# Patient Record
Sex: Female | Born: 1965 | Race: White | Hispanic: No | Marital: Married | Smoking: Former smoker
Health system: Southern US, Community
[De-identification: ages and names within clinical notes are randomized; demographics above are authoritative.]

## PROBLEM LIST (undated history)

## (undated) DIAGNOSIS — I10 Essential (primary) hypertension: Secondary | ICD-10-CM

## (undated) DIAGNOSIS — K5792 Diverticulitis of intestine, part unspecified, without perforation or abscess without bleeding: Secondary | ICD-10-CM

## (undated) DIAGNOSIS — E119 Type 2 diabetes mellitus without complications: Secondary | ICD-10-CM

## (undated) DIAGNOSIS — J449 Chronic obstructive pulmonary disease, unspecified: Secondary | ICD-10-CM

## (undated) DIAGNOSIS — J45909 Unspecified asthma, uncomplicated: Secondary | ICD-10-CM

## (undated) HISTORY — PX: CHOLECYSTECTOMY: SHX55

## (undated) HISTORY — PX: CYSTOSTOMY W/ BLADDER DILATION: SHX1432

---

## 2017-09-28 ENCOUNTER — Encounter (HOSPITAL_COMMUNITY): Payer: Self-pay | Admitting: Emergency Medicine

## 2017-09-28 ENCOUNTER — Emergency Department (HOSPITAL_COMMUNITY): Payer: Medicaid - Out of State

## 2017-09-28 ENCOUNTER — Other Ambulatory Visit: Payer: Self-pay

## 2017-09-28 ENCOUNTER — Emergency Department (HOSPITAL_COMMUNITY)
Admission: EM | Admit: 2017-09-28 | Discharge: 2017-09-28 | Disposition: A | Discharge status: Home / Self Care | Payer: Medicaid - Out of State | Attending: Emergency Medicine | Admitting: Emergency Medicine

## 2017-09-28 DIAGNOSIS — Z87891 Personal history of nicotine dependence: Secondary | ICD-10-CM | POA: Diagnosis not present

## 2017-09-28 DIAGNOSIS — K760 Fatty (change of) liver, not elsewhere classified: Secondary | ICD-10-CM | POA: Diagnosis not present

## 2017-09-28 DIAGNOSIS — R112 Nausea with vomiting, unspecified: Secondary | ICD-10-CM | POA: Diagnosis not present

## 2017-09-28 DIAGNOSIS — J449 Chronic obstructive pulmonary disease, unspecified: Secondary | ICD-10-CM | POA: Diagnosis not present

## 2017-09-28 DIAGNOSIS — K5792 Diverticulitis of intestine, part unspecified, without perforation or abscess without bleeding: Secondary | ICD-10-CM | POA: Diagnosis not present

## 2017-09-28 DIAGNOSIS — R197 Diarrhea, unspecified: Secondary | ICD-10-CM | POA: Insufficient documentation

## 2017-09-28 DIAGNOSIS — R161 Splenomegaly, not elsewhere classified: Secondary | ICD-10-CM | POA: Diagnosis not present

## 2017-09-28 DIAGNOSIS — Z885 Allergy status to narcotic agent status: Secondary | ICD-10-CM | POA: Insufficient documentation

## 2017-09-28 DIAGNOSIS — J45909 Unspecified asthma, uncomplicated: Secondary | ICD-10-CM | POA: Diagnosis not present

## 2017-09-28 DIAGNOSIS — R1084 Generalized abdominal pain: Secondary | ICD-10-CM | POA: Diagnosis present

## 2017-09-28 DIAGNOSIS — I10 Essential (primary) hypertension: Secondary | ICD-10-CM | POA: Diagnosis not present

## 2017-09-28 HISTORY — DX: Diverticulitis of intestine, part unspecified, without perforation or abscess without bleeding: K57.92

## 2017-09-28 HISTORY — DX: Chronic obstructive pulmonary disease, unspecified: J44.9

## 2017-09-28 HISTORY — DX: Unspecified asthma, uncomplicated: J45.909

## 2017-09-28 HISTORY — DX: Essential (primary) hypertension: I10

## 2017-09-28 LAB — CBC
HEMATOCRIT: 34 % — AB (ref 36.0–46.0)
HEMOGLOBIN: 10.6 g/dL — AB (ref 12.0–15.0)
MCH: 29 pg (ref 26.0–34.0)
MCHC: 31.2 g/dL (ref 30.0–36.0)
MCV: 92.9 fL (ref 78.0–100.0)
Platelets: 92 10*3/uL — ABNORMAL LOW (ref 150–400)
RBC: 3.66 MIL/uL — AB (ref 3.87–5.11)
RDW: 12.9 % (ref 11.5–15.5)
WBC: 5.6 10*3/uL (ref 4.0–10.5)

## 2017-09-28 LAB — COMPREHENSIVE METABOLIC PANEL
ALBUMIN: 3.9 g/dL (ref 3.5–5.0)
ALK PHOS: 52 U/L (ref 38–126)
ALT: 21 U/L (ref 14–54)
ANION GAP: 13 (ref 5–15)
AST: 27 U/L (ref 15–41)
BUN: 5 mg/dL — ABNORMAL LOW (ref 6–20)
CALCIUM: 9.3 mg/dL (ref 8.9–10.3)
CHLORIDE: 93 mmol/L — AB (ref 101–111)
CO2: 30 mmol/L (ref 22–32)
Creatinine, Ser: 0.69 mg/dL (ref 0.44–1.00)
GFR calc non Af Amer: >60 mL/min (ref >60)
GLUCOSE: 129 mg/dL — AB (ref 65–99)
POTASSIUM: 3.4 mmol/L — AB (ref 3.5–5.1)
Sodium: 136 mmol/L (ref 135–145)
Total Bilirubin: 0.6 mg/dL (ref 0.3–1.2)
Total Protein: 7.1 g/dL (ref 6.5–8.1)

## 2017-09-28 LAB — URINALYSIS, ROUTINE W REFLEX MICROSCOPIC
Bilirubin Urine: NEGATIVE
GLUCOSE, UA: NEGATIVE mg/dL
HGB URINE DIPSTICK: NEGATIVE
KETONES UR: NEGATIVE mg/dL
Nitrite: NEGATIVE
PROTEIN: NEGATIVE mg/dL
Specific Gravity, Urine: >1.046 — ABNORMAL HIGH (ref 1.005–1.030)
pH: 5 (ref 5.0–8.0)

## 2017-09-28 LAB — LIPASE, BLOOD: LIPASE: 30 U/L (ref 11–51)

## 2017-09-28 LAB — POC OCCULT BLOOD, ED: FECAL OCCULT BLD: NEGATIVE

## 2017-09-28 MED ORDER — ONDANSETRON HCL 4 MG/2ML IJ SOLN
4.0000 mg | INTRAMUSCULAR | Status: DC | PRN
  Administered 2017-09-28: 4 mg via INTRAVENOUS
  Filled 2017-09-28: qty 2

## 2017-09-28 MED ORDER — POTASSIUM CHLORIDE CRYS ER 20 MEQ PO TBCR
40.0000 meq | EXTENDED_RELEASE_TABLET | Freq: Once | ORAL | Status: AC
  Administered 2017-09-28: 40 meq via ORAL
  Filled 2017-09-28: qty 2

## 2017-09-28 MED ORDER — METRONIDAZOLE 500 MG PO TABS: 500.0000 mg | ORAL_TABLET | Freq: Three times a day (TID) | ORAL | 0 refills | Status: DC

## 2017-09-28 MED ORDER — CIPROFLOXACIN IN D5W 400 MG/200ML IV SOLN
400.0000 mg | Freq: Once | INTRAVENOUS | Status: AC
  Administered 2017-09-28: 400 mg via INTRAVENOUS
  Filled 2017-09-28: qty 200

## 2017-09-28 MED ORDER — IOPAMIDOL (ISOVUE-300) INJECTION 61%
100.0000 mL | Freq: Once | INTRAVENOUS | Status: AC | PRN
  Administered 2017-09-28: 100 mL via INTRAVENOUS

## 2017-09-28 MED ORDER — CIPROFLOXACIN HCL 500 MG PO TABS: 500.0000 mg | ORAL_TABLET | Freq: Two times a day (BID) | ORAL | 0 refills | Status: DC

## 2017-09-28 MED ORDER — SODIUM CHLORIDE 0.9 % IV BOLUS (SEPSIS)
500.0000 mL | Freq: Once | INTRAVENOUS | Status: AC
  Administered 2017-09-28: 500 mL via INTRAVENOUS

## 2017-09-28 MED ORDER — METRONIDAZOLE IN NACL 5-0.79 MG/ML-% IV SOLN
500.0000 mg | Freq: Once | INTRAVENOUS | Status: AC
  Administered 2017-09-28: 500 mg via INTRAVENOUS
  Filled 2017-09-28: qty 100

## 2017-09-28 MED ORDER — SODIUM CHLORIDE 0.9 % IV SOLN
INTRAVENOUS | Status: DC
  Administered 2017-09-28: 20:00:00 via INTRAVENOUS

## 2017-09-28 NOTE — ED Notes (Signed)
Tech at bedside with patient. Collecting vitals

## 2017-09-28 NOTE — ED Notes (Signed)
Pt denies nausea or vomiting since taking the potassium. Pt states she would like another Ginger ale.

## 2017-09-28 NOTE — ED Triage Notes (Addendum)
Patient reports history of diverticulitis, c/o n/v and abdominal pain that hurts into her rectum. Onset of symptoms for 1 week. Seen by PCP last week.

## 2017-09-28 NOTE — ED Notes (Signed)
3rd ginger ale given

## 2017-09-28 NOTE — Discharge Instructions (Signed)
Take the prescriptions as directed.  Take in a fluids diet for the next few days, followed by a bland diet. Advance to your regular diet slowly as you can tolerate it.  Avoid full strength juices, as well as milk and milk products until your diarrhea has resolved.   Call your regular medical doctor tomorrow to schedule a follow up appointment in the next 1 to 2 days. Call the GI doctor tomorrow to schedule a follow up appointment within the next week.  Return to the Emergency Department immediately if not improving (or even worsening) despite taking the medicines as prescribed, any black or bloody stool or vomit, if you develop a fever over "101," or for any other concerns.

## 2017-09-28 NOTE — ED Notes (Signed)
Pt drank all of her ginger ale and asked for another drink. Pt swallowed potassium pills with no problem. Will monitor for nausea or vomiting.

## 2017-09-28 NOTE — ED Notes (Signed)
Pt states she does not feel nauseous at this time. Ginger ale given to patient.

## 2017-09-28 NOTE — ED Provider Notes (Signed)
[facility] EMERGENCY DEPARTMENT Provider Note   CSN: [id number] Arrival date & time: 09/28/17  1200     History   Chief Complaint Chief Complaint  Patient presents with  . Abdominal Pain  . Emesis    HPI Albirtha Grinage is a 52 y.o. female.  HPI  Pt was seen at 1630.  Per pt, c/o gradual onset and persistence of constant generalized abd "pain" for the past 1 week.  Has been associated with multiple intermittent episodes of N/V/D.  Describes the abd pain as "throbbing" and "like the last time I had diverticulitis."  Denies fevers, no back pain, no rash, no CP/SOB, no black or blood in emesis, no blood in stools. Pt states her stools are "sometimes black because I take iron."       Past Medical History:  Diagnosis Date  . Asthma   . COPD (chronic obstructive pulmonary disease) (HCC)   . Diverticulitis   . Hypertension     There are no active problems to display for this patient.   Past Surgical History:  Procedure Laterality Date  . CHOLECYSTECTOMY    . CYSTOSTOMY W/ BLADDER DILATION      OB History    Gravida Para Term Preterm AB Living   3 3 3          SAB TAB Ectopic Multiple Live Births                   Home Medications    Prior to Admission medications   Not on File    Family History Family History  Problem Relation Age of Onset  . Diabetes Mother   . Hypertension Mother   . Alzheimer's disease Mother   . COPD Mother   . Emphysema Father   . Alzheimer's disease Father     Social History Social History   Tobacco Use  . Smoking status: Former Games developer  . Smokeless tobacco: Never Used  Substance Use Topics  . Alcohol use: No    Frequency: Never  . Drug use: No     Allergies   Codeine and Morphine and related   Review of Systems Review of Systems ROS: Statement: All systems negative except as marked or noted in the HPI; Constitutional: Negative for fever and chills. ; ; Eyes: Negative for eye pain, redness and discharge. ; ; ENMT:  Negative for ear pain, hoarseness, nasal congestion, sinus pressure and sore throat. ; ; Cardiovascular: Negative for chest pain, palpitations, diaphoresis, dyspnea and peripheral edema. ; ; Respiratory: Negative for cough, wheezing and stridor. ; ; Gastrointestinal: +N/V/D, abd pain. Negative for blood in stool, hematemesis, jaundice and rectal bleeding. ; ; Genitourinary: Negative for dysuria, flank pain and hematuria. ; ; Musculoskeletal: Negative for back pain and neck pain. Negative for swelling and trauma.; ; Skin: Negative for pruritus, rash, abrasions, blisters, bruising and skin lesion.; ; Neuro: Negative for headache, lightheadedness and neck stiffness. Negative for weakness, altered level of consciousness, altered mental status, extremity weakness, paresthesias, involuntary movement, seizure and syncope.       Physical Exam Updated Vital Signs BP (!) 103/49 (BP Location: Right Arm)   Pulse (!) 113   Temp 97.8 F (36.6 C) (Oral)   Resp 18   Ht 5' (1.524 m)   Wt 128.8 kg (284 lb)   SpO2 96%   BMI 55.46 kg/m    Patient Vitals for the past 24 hrs:  BP Temp Temp src Pulse Resp SpO2 Height Weight  09/28/17 2130 115/60 - - Marland Kitchen)  103 - 100 % - -  09/28/17 2052 - - - (!) 103 - 100 % - -  09/28/17 2030 - - - (!) 102 - 100 % - -  09/28/17 2026 - - - (!) 103 - 100 % - -  09/28/17 1933 (!) 130/55 - - (!) 110 20 100 % - -  09/28/17 1931 (!) 130/55 - - (!) 111 - 100 % - -  09/28/17 1930 114/65 - - (!) 104 - 100 % - -  09/28/17 1927 (!) 112/57 - - (!) 102 - 100 % - -  09/28/17 1700 106/66 - - (!) 102 - 100 % - -  09/28/17 1645 - - - - - 100 % - -  09/28/17 1642 (!) 110/47 97.7 F (36.5 C) Oral (!) 107 18 100 % - -  09/28/17 1530 (!) 103/49 97.8 F (36.6 C) Oral (!) 113 18 96 % - -  09/28/17 1247 - - - - - - 5' (1.524 m) 128.8 kg (284 lb)  09/28/17 1246 111/71 98 F (36.7 C) Oral (!) 110 20 94 % - -   19:28 Orthostatic Vital Signs LN  Orthostatic Lying   BP- Lying: 112/57  Pulse-  Lying: 102      Orthostatic Sitting  BP- Sitting: 114/65  Pulse- Sitting: 102      Orthostatic Standing at 0 minutes  BP- Standing at 0 minutes: 130/55  Pulse- Standing at 0 minutes: 110     Physical Exam 1635: Physical examination:  Nursing notes reviewed; Vital signs and O2 SAT reviewed;  Constitutional: Well developed, Well nourished, Well hydrated, In no acute distress; Head:  Normocephalic, atraumatic; Eyes: EOMI, PERRL, No scleral icterus; ENMT: Mouth and pharynx normal, Mucous membranes moist; Neck: Supple, Full range of motion, No lymphadenopathy; Cardiovascular: Tachycardic rate and rhythm, No gallop; Respiratory: Breath sounds coarse & equal bilaterally, No wheezes.  Speaking full sentences with ease, Normal respiratory effort/excursion; Chest: Nontender, Movement normal; Abdomen: Soft, Obese. +generalized tenderness to palp. No rebound or guarding. Nondistended, Normal bowel sounds; Genitourinary: No CVA tenderness; Extremities: Pulses normal, No tenderness, No edema, No calf edema or asymmetry.; Neuro: AA&Ox3, Major CN grossly intact.  Speech clear. No gross focal motor or sensory deficits in extremities.; Skin: Color normal, Warm, Dry.   ED Treatments / Results  Labs (all labs ordered are listed, but only abnormal results are displayed)   EKG  EKG Interpretation None       Radiology   Procedures Procedures (including critical care time)  Medications Ordered in ED Medications  sodium chloride 0.9 % bolus 500 mL (not administered)  0.9 %  sodium chloride infusion (not administered)  ondansetron (ZOFRAN) injection 4 mg (not administered)     Initial Impression / Assessment and Plan / ED Course  I have reviewed the triage vital signs and the nursing notes.  Pertinent labs & imaging results that were available during my care of the patient were reviewed by me and considered in my medical decision making (see chart for details).  MDM Reviewed: previous chart,  nursing note and vitals Reviewed previous: labs Interpretation: labs, CT scan and x-ray    Results for orders placed or performed during the hospital encounter of 09/28/17  Lipase, blood  Result Value Ref Range   Lipase 30 11 - 51 U/L  Comprehensive metabolic panel  Result Value Ref Range   Sodium 136 135 - 145 mmol/L   Potassium 3.4 (L) 3.5 - 5.1 mmol/L   Chloride 93 (L)  101 - 111 mmol/L   CO2 30 22 - 32 mmol/L   Glucose, Bld 129 (H) 65 - 99 mg/dL   BUN 5 (L) 6 - 20 mg/dL   Creatinine, Ser 1.61 0.44 - 1.00 mg/dL   Calcium 9.3 8.9 - 09.6 mg/dL   Total Protein 7.1 6.5 - 8.1 g/dL   Albumin 3.9 3.5 - 5.0 g/dL   AST 27 15 - 41 U/L   ALT 21 14 - 54 U/L   Alkaline Phosphatase 52 38 - 126 U/L   Total Bilirubin 0.6 0.3 - 1.2 mg/dL   GFR calc non Af Amer >60 >60 mL/min   GFR calc Af Amer >60 >60 mL/min   Anion gap 13 5 - 15  CBC  Result Value Ref Range   WBC 5.6 4.0 - 10.5 K/uL   RBC 3.66 (L) 3.87 - 5.11 MIL/uL   Hemoglobin 10.6 (L) 12.0 - 15.0 g/dL   HCT 04.5 (L) 40.9 - 81.1 %   MCV 92.9 78.0 - 100.0 fL   MCH 29.0 26.0 - 34.0 pg   MCHC 31.2 30.0 - 36.0 g/dL   RDW 91.4 78.2 - 95.6 %   Platelets 92 (L) 150 - 400 K/uL  Urinalysis, Routine w reflex microscopic  Result Value Ref Range   Color, Urine YELLOW YELLOW   APPearance HAZY (A) CLEAR   Specific Gravity, Urine >1.046 (H) 1.005 - 1.030   pH 5.0 5.0 - 8.0   Glucose, UA NEGATIVE NEGATIVE mg/dL   Hgb urine dipstick NEGATIVE NEGATIVE   Bilirubin Urine NEGATIVE NEGATIVE   Ketones, ur NEGATIVE NEGATIVE mg/dL   Protein, ur NEGATIVE NEGATIVE mg/dL   Nitrite NEGATIVE NEGATIVE   Leukocytes, UA TRACE (A) NEGATIVE   RBC / HPF 0-5 0 - 5 RBC/hpf   WBC, UA 0-5 0 - 5 WBC/hpf   Bacteria, UA RARE (A) NONE SEEN   Squamous Epithelial / LPF 0-5 (A) NONE SEEN   Mucus PRESENT   POC occult blood, ED  Result Value Ref Range   Fecal Occult Bld NEGATIVE NEGATIVE   Dg Chest 2 View Result Date: 09/28/2017 CLINICAL DATA:  Diverticulitis.  History of asthma. Chronic shortness of breath. EXAM: CHEST  2 VIEW COMPARISON:  None. FINDINGS: The heart size and mediastinal contours are within normal limits. Both lungs are clear. The visualized skeletal structures are unremarkable. IMPRESSION: No active cardiopulmonary disease. Electronically Signed   By: Gerome Sam III M.D   On: 09/28/2017 17:49   Ct Abdomen Pelvis W Contrast  Result Date: 09/28/2017 CLINICAL DATA:  52 y/o F; history of diverticulitis. Presenting with nausea, vomiting, and abdominal pain extending into the rectum. EXAM: CT ABDOMEN AND PELVIS WITH CONTRAST TECHNIQUE: Multidetector CT imaging of the abdomen and pelvis was performed using the standard protocol following bolus administration of intravenous contrast. CONTRAST:  ISOVUE-300 IOPAMIDOL (ISOVUE-300) INJECTION 61% COMPARISON:  None. FINDINGS: Lower chest: Small cluster of nodules in the dependent right lower lobe probably representing minimal bronchiolitis. Hepatobiliary: Hepatic steatosis. No focal liver abnormality is seen. Cholecystectomy. No biliary ductal dilatation. Pancreas: Unremarkable. No pancreatic ductal dilatation or surrounding inflammatory changes. Spleen: Spleen measures 14.6 x 9.7 x 18.5 cm (volume = 1400 cm^3). Adrenals/Urinary Tract: Adrenal glands are unremarkable. Kidneys are normal, without renal calculi, focal lesion, or hydronephrosis. Bladder is unremarkable. Stomach/Bowel: Stomach is within normal limits. Appendix appears normal. Moderate sigmoid diverticulosis. There is mild pericolonic edema and fluid within the left pericolic gutter surrounding the sigmoid colon (series 6, image 54). Vascular/Lymphatic:  Aortic atherosclerosis. No enlarged abdominal or pelvic lymph nodes. Reproductive: Uterus and bilateral adnexa are unremarkable. Other: Small paramedian right lower abdominal hernia containing fat. Mild mesenteric edema. Musculoskeletal: No acute or significant osseous findings. IMPRESSION: 1.  Moderate sigmoid diverticulosis. Mild sigmoid pericolonic edema and fluid in left pericolic gutter probably represents underlying colitis/diverticulitis. No perforation or abscess. 2. Hepatic steatosis. 3. Splenomegaly, 1400 cc. Electronically Signed   By: Mitzi HansenLance  Furusawa-Stratton M.D.   On: 09/28/2017 18:47    2130:  Pt has tol PO well while in the ED without N/V.  No stooling while in the ED. IVF given for tachycardia during orthostatic VS; pt denied orthostasis. Pt states she "gets anxious" and that is "why my HR is high." Pt offered admission. Pt absolutely does not want to be admitted. Risks/benefits explained. Pt verb understanding. Pt makes her own medical decisions and continues to refuse admission. Pt was willing to stay for IV abx doses. Strict return precautions given. Dx and testing d/w pt and family.  Questions answered.  Verb understanding, agreeable to d/c home with outpt f/u.      Final Clinical Impressions(s) / ED Diagnoses   Final diagnoses:  None    ED Discharge Orders    None       Samuel JesterMcManus, Jais Demir, DO 10/02/17 0818

## 2017-12-01 ENCOUNTER — Emergency Department (HOSPITAL_COMMUNITY): Payer: Medicaid - Out of State

## 2017-12-01 ENCOUNTER — Other Ambulatory Visit: Payer: Self-pay

## 2017-12-01 ENCOUNTER — Inpatient Hospital Stay (HOSPITAL_COMMUNITY)
Admission: EM | Admit: 2017-12-01 | Discharge: 2017-12-09 | DRG: 391 | Disposition: A | Discharge status: Home Health | Payer: Medicaid - Out of State | Attending: Family Medicine | Admitting: Family Medicine

## 2017-12-01 ENCOUNTER — Encounter (HOSPITAL_COMMUNITY): Payer: Self-pay | Admitting: *Deleted

## 2017-12-01 DIAGNOSIS — T451X5A Adverse effect of antineoplastic and immunosuppressive drugs, initial encounter: Secondary | ICD-10-CM

## 2017-12-01 DIAGNOSIS — Z6838 Body mass index (BMI) 38.0-38.9, adult: Secondary | ICD-10-CM

## 2017-12-01 DIAGNOSIS — Z1624 Resistance to multiple antibiotics: Secondary | ICD-10-CM | POA: Diagnosis not present

## 2017-12-01 DIAGNOSIS — Z885 Allergy status to narcotic agent status: Secondary | ICD-10-CM

## 2017-12-01 DIAGNOSIS — B9620 Unspecified Escherichia coli [E. coli] as the cause of diseases classified elsewhere: Secondary | ICD-10-CM | POA: Diagnosis not present

## 2017-12-01 DIAGNOSIS — Z87891 Personal history of nicotine dependence: Secondary | ICD-10-CM | POA: Diagnosis not present

## 2017-12-01 DIAGNOSIS — F419 Anxiety disorder, unspecified: Secondary | ICD-10-CM | POA: Diagnosis present

## 2017-12-01 DIAGNOSIS — F0280 Dementia in other diseases classified elsewhere without behavioral disturbance: Secondary | ICD-10-CM | POA: Diagnosis present

## 2017-12-01 DIAGNOSIS — B370 Candidal stomatitis: Secondary | ICD-10-CM | POA: Diagnosis present

## 2017-12-01 DIAGNOSIS — L8990 Pressure ulcer of unspecified site, unspecified stage: Secondary | ICD-10-CM | POA: Diagnosis present

## 2017-12-01 DIAGNOSIS — A419 Sepsis, unspecified organism: Secondary | ICD-10-CM | POA: Diagnosis not present

## 2017-12-01 DIAGNOSIS — G309 Alzheimer's disease, unspecified: Secondary | ICD-10-CM | POA: Diagnosis present

## 2017-12-01 DIAGNOSIS — K529 Noninfective gastroenteritis and colitis, unspecified: Secondary | ICD-10-CM | POA: Diagnosis present

## 2017-12-01 DIAGNOSIS — E860 Dehydration: Secondary | ICD-10-CM | POA: Diagnosis present

## 2017-12-01 DIAGNOSIS — D61810 Antineoplastic chemotherapy induced pancytopenia: Secondary | ICD-10-CM

## 2017-12-01 DIAGNOSIS — Z79899 Other long term (current) drug therapy: Secondary | ICD-10-CM

## 2017-12-01 DIAGNOSIS — E785 Hyperlipidemia, unspecified: Secondary | ICD-10-CM | POA: Diagnosis present

## 2017-12-01 DIAGNOSIS — K909 Intestinal malabsorption, unspecified: Secondary | ICD-10-CM | POA: Diagnosis present

## 2017-12-01 DIAGNOSIS — E43 Unspecified severe protein-calorie malnutrition: Secondary | ICD-10-CM | POA: Diagnosis present

## 2017-12-01 DIAGNOSIS — D61818 Other pancytopenia: Secondary | ICD-10-CM

## 2017-12-01 DIAGNOSIS — E871 Hypo-osmolality and hyponatremia: Secondary | ICD-10-CM | POA: Diagnosis present

## 2017-12-01 DIAGNOSIS — N390 Urinary tract infection, site not specified: Secondary | ICD-10-CM | POA: Diagnosis present

## 2017-12-01 DIAGNOSIS — J181 Lobar pneumonia, unspecified organism: Secondary | ICD-10-CM | POA: Diagnosis present

## 2017-12-01 DIAGNOSIS — E876 Hypokalemia: Secondary | ICD-10-CM | POA: Diagnosis present

## 2017-12-01 DIAGNOSIS — K5780 Diverticulitis of intestine, part unspecified, with perforation and abscess without bleeding: Secondary | ICD-10-CM | POA: Diagnosis not present

## 2017-12-01 DIAGNOSIS — E861 Hypovolemia: Secondary | ICD-10-CM | POA: Diagnosis not present

## 2017-12-01 DIAGNOSIS — D638 Anemia in other chronic diseases classified elsewhere: Secondary | ICD-10-CM | POA: Diagnosis present

## 2017-12-01 DIAGNOSIS — Z8719 Personal history of other diseases of the digestive system: Secondary | ICD-10-CM | POA: Diagnosis not present

## 2017-12-01 DIAGNOSIS — D696 Thrombocytopenia, unspecified: Secondary | ICD-10-CM

## 2017-12-01 DIAGNOSIS — D649 Anemia, unspecified: Secondary | ICD-10-CM | POA: Diagnosis not present

## 2017-12-01 DIAGNOSIS — K760 Fatty (change of) liver, not elsewhere classified: Secondary | ICD-10-CM | POA: Diagnosis present

## 2017-12-01 DIAGNOSIS — K5720 Diverticulitis of large intestine with perforation and abscess without bleeding: Secondary | ICD-10-CM | POA: Diagnosis present

## 2017-12-01 DIAGNOSIS — I10 Essential (primary) hypertension: Secondary | ICD-10-CM

## 2017-12-01 DIAGNOSIS — I959 Hypotension, unspecified: Secondary | ICD-10-CM | POA: Diagnosis present

## 2017-12-01 DIAGNOSIS — E8809 Other disorders of plasma-protein metabolism, not elsewhere classified: Secondary | ICD-10-CM | POA: Diagnosis present

## 2017-12-01 DIAGNOSIS — I5189 Other ill-defined heart diseases: Secondary | ICD-10-CM

## 2017-12-01 DIAGNOSIS — E1143 Type 2 diabetes mellitus with diabetic autonomic (poly)neuropathy: Secondary | ICD-10-CM | POA: Diagnosis present

## 2017-12-01 DIAGNOSIS — J440 Chronic obstructive pulmonary disease with acute lower respiratory infection: Secondary | ICD-10-CM | POA: Diagnosis present

## 2017-12-01 DIAGNOSIS — R7989 Other specified abnormal findings of blood chemistry: Secondary | ICD-10-CM | POA: Diagnosis present

## 2017-12-01 DIAGNOSIS — Z836 Family history of other diseases of the respiratory system: Secondary | ICD-10-CM | POA: Diagnosis not present

## 2017-12-01 DIAGNOSIS — R197 Diarrhea, unspecified: Secondary | ICD-10-CM | POA: Diagnosis not present

## 2017-12-01 DIAGNOSIS — E039 Hypothyroidism, unspecified: Secondary | ICD-10-CM | POA: Diagnosis present

## 2017-12-01 DIAGNOSIS — I5030 Unspecified diastolic (congestive) heart failure: Secondary | ICD-10-CM | POA: Diagnosis not present

## 2017-12-01 DIAGNOSIS — R5381 Other malaise: Secondary | ICD-10-CM | POA: Diagnosis present

## 2017-12-01 DIAGNOSIS — I9589 Other hypotension: Secondary | ICD-10-CM | POA: Diagnosis not present

## 2017-12-01 DIAGNOSIS — Z7952 Long term (current) use of systemic steroids: Secondary | ICD-10-CM

## 2017-12-01 DIAGNOSIS — R161 Splenomegaly, not elsewhere classified: Secondary | ICD-10-CM | POA: Diagnosis present

## 2017-12-01 DIAGNOSIS — D72829 Elevated white blood cell count, unspecified: Secondary | ICD-10-CM | POA: Diagnosis not present

## 2017-12-01 DIAGNOSIS — F329 Major depressive disorder, single episode, unspecified: Secondary | ICD-10-CM | POA: Diagnosis present

## 2017-12-01 DIAGNOSIS — I5032 Chronic diastolic (congestive) heart failure: Secondary | ICD-10-CM | POA: Diagnosis present

## 2017-12-01 DIAGNOSIS — J449 Chronic obstructive pulmonary disease, unspecified: Secondary | ICD-10-CM

## 2017-12-01 DIAGNOSIS — K5732 Diverticulitis of large intestine without perforation or abscess without bleeding: Secondary | ICD-10-CM | POA: Diagnosis not present

## 2017-12-01 DIAGNOSIS — L89150 Pressure ulcer of sacral region, unstageable: Secondary | ICD-10-CM | POA: Diagnosis present

## 2017-12-01 DIAGNOSIS — R627 Adult failure to thrive: Secondary | ICD-10-CM | POA: Diagnosis not present

## 2017-12-01 DIAGNOSIS — K219 Gastro-esophageal reflux disease without esophagitis: Secondary | ICD-10-CM | POA: Diagnosis present

## 2017-12-01 DIAGNOSIS — J189 Pneumonia, unspecified organism: Secondary | ICD-10-CM | POA: Diagnosis present

## 2017-12-01 DIAGNOSIS — D509 Iron deficiency anemia, unspecified: Secondary | ICD-10-CM | POA: Diagnosis present

## 2017-12-01 DIAGNOSIS — R571 Hypovolemic shock: Secondary | ICD-10-CM | POA: Diagnosis present

## 2017-12-01 DIAGNOSIS — R8281 Pyuria: Secondary | ICD-10-CM | POA: Diagnosis present

## 2017-12-01 DIAGNOSIS — J9611 Chronic respiratory failure with hypoxia: Secondary | ICD-10-CM | POA: Diagnosis present

## 2017-12-01 DIAGNOSIS — Z9981 Dependence on supplemental oxygen: Secondary | ICD-10-CM

## 2017-12-01 DIAGNOSIS — Z86010 Personal history of colonic polyps: Secondary | ICD-10-CM

## 2017-12-01 DIAGNOSIS — I110 Hypertensive heart disease with heart failure: Secondary | ICD-10-CM | POA: Diagnosis present

## 2017-12-01 HISTORY — DX: Type 2 diabetes mellitus without complications: E11.9

## 2017-12-01 LAB — URINALYSIS, ROUTINE W REFLEX MICROSCOPIC
Bilirubin Urine: NEGATIVE
Glucose, UA: NEGATIVE mg/dL
Ketones, ur: NEGATIVE mg/dL
Nitrite: NEGATIVE
PROTEIN: NEGATIVE mg/dL
Specific Gravity, Urine: >1.046 — ABNORMAL HIGH (ref 1.005–1.030)
pH: 5 (ref 5.0–8.0)

## 2017-12-01 LAB — COMPREHENSIVE METABOLIC PANEL
ALT: 12 U/L — AB (ref 14–54)
AST: 23 U/L (ref 15–41)
Albumin: 2.9 g/dL — ABNORMAL LOW (ref 3.5–5.0)
Alkaline Phosphatase: 100 U/L (ref 38–126)
Anion gap: 14 (ref 5–15)
BUN: 10 mg/dL (ref 6–20)
CHLORIDE: 88 mmol/L — AB (ref 101–111)
CO2: 24 mmol/L (ref 22–32)
CREATININE: 0.73 mg/dL (ref 0.44–1.00)
Calcium: 8.8 mg/dL — ABNORMAL LOW (ref 8.9–10.3)
GFR calc Af Amer: >60 mL/min (ref >60)
Glucose, Bld: 115 mg/dL — ABNORMAL HIGH (ref 65–99)
Potassium: 4.2 mmol/L (ref 3.5–5.1)
Sodium: 126 mmol/L — ABNORMAL LOW (ref 135–145)
Total Bilirubin: 0.7 mg/dL (ref 0.3–1.2)
Total Protein: 6.3 g/dL — ABNORMAL LOW (ref 6.5–8.1)

## 2017-12-01 LAB — SODIUM, URINE, RANDOM: Sodium, Ur: <10 mmol/L

## 2017-12-01 LAB — CBC WITH DIFFERENTIAL/PLATELET
Basophils Absolute: 0 10*3/uL (ref 0.0–0.1)
Basophils Relative: 0 %
Eosinophils Absolute: 0 10*3/uL (ref 0.0–0.7)
Eosinophils Relative: 0 %
HCT: 31.7 % — ABNORMAL LOW (ref 36.0–46.0)
Hemoglobin: 10.1 g/dL — ABNORMAL LOW (ref 12.0–15.0)
LYMPHS ABS: 1.3 10*3/uL (ref 0.7–4.0)
LYMPHS PCT: 13 %
MCH: 27.7 pg (ref 26.0–34.0)
MCHC: 31.9 g/dL (ref 30.0–36.0)
MCV: 87.1 fL (ref 78.0–100.0)
MONO ABS: 0.4 10*3/uL (ref 0.1–1.0)
MONOS PCT: 4 %
Neutro Abs: 7.8 10*3/uL — ABNORMAL HIGH (ref 1.7–7.7)
Neutrophils Relative %: 83 %
Platelets: 120 10*3/uL — ABNORMAL LOW (ref 150–400)
RBC: 3.64 MIL/uL — ABNORMAL LOW (ref 3.87–5.11)
RDW: 14 % (ref 11.5–15.5)
WBC: 9.5 10*3/uL (ref 4.0–10.5)

## 2017-12-01 LAB — PROTIME-INR
INR: 1.06
Prothrombin Time: 13.7 seconds (ref 11.4–15.2)

## 2017-12-01 LAB — GASTROINTESTINAL PANEL BY PCR, STOOL (REPLACES STOOL CULTURE)
ADENOVIRUS F40/41: NOT DETECTED
ASTROVIRUS: NOT DETECTED
CAMPYLOBACTER SPECIES: NOT DETECTED
Cryptosporidium: NOT DETECTED
Cyclospora cayetanensis: NOT DETECTED
ENTAMOEBA HISTOLYTICA: NOT DETECTED
ENTEROPATHOGENIC E COLI (EPEC): NOT DETECTED
ENTEROTOXIGENIC E COLI (ETEC): NOT DETECTED
Enteroaggregative E coli (EAEC): NOT DETECTED
Giardia lamblia: NOT DETECTED
NOROVIRUS GI/GII: NOT DETECTED
PLESIMONAS SHIGELLOIDES: NOT DETECTED
Rotavirus A: NOT DETECTED
SAPOVIRUS (I, II, IV, AND V): NOT DETECTED
Salmonella species: NOT DETECTED
Shiga like toxin producing E coli (STEC): NOT DETECTED
Shigella/Enteroinvasive E coli (EIEC): NOT DETECTED
VIBRIO CHOLERAE: NOT DETECTED
Vibrio species: NOT DETECTED
Yersinia enterocolitica: NOT DETECTED

## 2017-12-01 LAB — I-STAT CG4 LACTIC ACID, ED
LACTIC ACID, VENOUS: 1.48 mmol/L (ref 0.5–1.9)
Lactic Acid, Venous: 1.24 mmol/L (ref 0.5–1.9)

## 2017-12-01 LAB — CREATININE, URINE, RANDOM: CREATININE, URINE: 34.73 mg/dL

## 2017-12-01 LAB — C DIFFICILE QUICK SCREEN W PCR REFLEX
C DIFFICILE (CDIFF) TOXIN: NEGATIVE
C DIFFICLE (CDIFF) ANTIGEN: NEGATIVE
C Diff interpretation: NOT DETECTED

## 2017-12-01 LAB — LIPASE, BLOOD: LIPASE: 91 U/L — AB (ref 11–51)

## 2017-12-01 LAB — CORTISOL: Cortisol, Plasma: 17.8 ug/dL

## 2017-12-01 LAB — PROCALCITONIN: Procalcitonin: 0.51 ng/mL

## 2017-12-01 MED ORDER — MOMETASONE FURO-FORMOTEROL FUM 200-5 MCG/ACT IN AERO
2.0000 | INHALATION_SPRAY | Freq: Two times a day (BID) | RESPIRATORY_TRACT | Status: DC
  Administered 2017-12-02 – 2017-12-08 (×13): 2 via RESPIRATORY_TRACT
  Filled 2017-12-01 (×3): qty 8.8

## 2017-12-01 MED ORDER — ONDANSETRON HCL 4 MG/2ML IJ SOLN: 4.0000 mg | Freq: Four times a day (QID) | INTRAMUSCULAR | Status: DC | PRN

## 2017-12-01 MED ORDER — LACTATED RINGERS IV BOLUS
1000.0000 mL | Freq: Once | INTRAVENOUS | Status: AC
  Administered 2017-12-01: 1000 mL via INTRAVENOUS

## 2017-12-01 MED ORDER — PIPERACILLIN-TAZOBACTAM 3.375 G IVPB
3.3750 g | Freq: Three times a day (TID) | INTRAVENOUS | Status: DC
  Administered 2017-12-02 – 2017-12-04 (×7): 3.375 g via INTRAVENOUS
  Filled 2017-12-01 (×9): qty 50

## 2017-12-01 MED ORDER — ALBUTEROL SULFATE (2.5 MG/3ML) 0.083% IN NEBU
2.5000 mg | INHALATION_SOLUTION | Freq: Two times a day (BID) | RESPIRATORY_TRACT | Status: DC | PRN
  Administered 2017-12-03: 2.5 mg via RESPIRATORY_TRACT
  Filled 2017-12-01: qty 3

## 2017-12-01 MED ORDER — PIPERACILLIN-TAZOBACTAM 3.375 G IVPB
3.3750 g | Freq: Three times a day (TID) | INTRAVENOUS | Status: DC
  Administered 2017-12-01: 3.375 g via INTRAVENOUS
  Filled 2017-12-01: qty 50

## 2017-12-01 MED ORDER — RISPERIDONE 0.5 MG PO TABS: 4.0000 mg | ORAL_TABLET | Freq: Every day | ORAL | Status: DC

## 2017-12-01 MED ORDER — LACTATED RINGERS IV BOLUS (SEPSIS)
1000.0000 mL | Freq: Once | INTRAVENOUS | Status: AC
  Administered 2017-12-01: 1000 mL via INTRAVENOUS

## 2017-12-01 MED ORDER — IOPAMIDOL (ISOVUE-300) INJECTION 61%
100.0000 mL | Freq: Once | INTRAVENOUS | Status: AC
Start: 2017-12-01 — End: 2017-12-01
  Administered 2017-12-01: 100 mL via INTRAVENOUS

## 2017-12-01 MED ORDER — PANTOPRAZOLE SODIUM 40 MG PO TBEC
40.0000 mg | DELAYED_RELEASE_TABLET | Freq: Every day | ORAL | Status: DC
  Administered 2017-12-02 – 2017-12-09 (×8): 40 mg via ORAL
  Filled 2017-12-01 (×8): qty 1

## 2017-12-01 MED ORDER — HYDROCORTISONE NA SUCCINATE PF 100 MG IJ SOLR: 50.0000 mg | Freq: Four times a day (QID) | INTRAMUSCULAR | Status: DC

## 2017-12-01 MED ORDER — SERTRALINE HCL 100 MG PO TABS: 100.0000 mg | ORAL_TABLET | Freq: Two times a day (BID) | ORAL | Status: DC

## 2017-12-01 MED ORDER — SODIUM CHLORIDE 0.9 % IV SOLN
INTRAVENOUS | Status: DC
  Administered 2017-12-01 – 2017-12-06 (×8): via INTRAVENOUS

## 2017-12-01 MED ORDER — ONDANSETRON HCL 4 MG PO TABS: 4.0000 mg | ORAL_TABLET | Freq: Four times a day (QID) | ORAL | Status: DC | PRN

## 2017-12-01 MED ORDER — NYSTATIN 100000 UNIT/ML MT SUSP
5.0000 mL | Freq: Four times a day (QID) | OROMUCOSAL | Status: DC
  Administered 2017-12-01 – 2017-12-09 (×29): 500000 [IU] via ORAL
  Filled 2017-12-01 (×29): qty 5

## 2017-12-01 MED ORDER — LACTATED RINGERS IV SOLN: INTRAVENOUS | Status: DC

## 2017-12-01 MED ORDER — PHENYLEPHRINE HCL 10 MG/ML IJ SOLN
0.0000 ug/min | INTRAMUSCULAR | Status: DC
  Administered 2017-12-01: 80 ug/min via INTRAVENOUS
  Administered 2017-12-01: 30 ug/min via INTRAVENOUS
  Administered 2017-12-01 – 2017-12-02 (×2): 20 ug/min via INTRAVENOUS
  Administered 2017-12-02: 80 ug/min via INTRAVENOUS
  Filled 2017-12-01 (×4): qty 1

## 2017-12-01 MED ORDER — ENOXAPARIN SODIUM 40 MG/0.4ML ~~LOC~~ SOLN
40.0000 mg | SUBCUTANEOUS | Status: DC
  Administered 2017-12-01: 40 mg via SUBCUTANEOUS
  Filled 2017-12-01: qty 0.4

## 2017-12-01 MED ORDER — IOPAMIDOL (ISOVUE-300) INJECTION 61%
INTRAVENOUS | Status: AC
  Filled 2017-12-01: qty 100

## 2017-12-01 MED ORDER — SODIUM CHLORIDE 0.9 % IV SOLN: 250.0000 mL | INTRAVENOUS | Status: DC | PRN

## 2017-12-01 MED ORDER — MONTELUKAST SODIUM 10 MG PO TABS
10.0000 mg | ORAL_TABLET | Freq: Every day | ORAL | Status: DC
  Administered 2017-12-01 – 2017-12-08 (×8): 10 mg via ORAL
  Filled 2017-12-01 (×8): qty 1

## 2017-12-01 MED ORDER — PRAVASTATIN SODIUM 40 MG PO TABS
80.0000 mg | ORAL_TABLET | Freq: Every day | ORAL | Status: DC
Start: 2017-12-01 — End: 2017-12-09
  Administered 2017-12-01 – 2017-12-08 (×8): 80 mg via ORAL
  Filled 2017-12-01 (×8): qty 2

## 2017-12-01 MED ORDER — LACTATED RINGERS IV BOLUS (SEPSIS)
500.0000 mL | Freq: Once | INTRAVENOUS | Status: AC
Start: 2017-12-01 — End: 2017-12-01
  Administered 2017-12-01: 500 mL via INTRAVENOUS

## 2017-12-01 MED ORDER — PIPERACILLIN-TAZOBACTAM 3.375 G IVPB 30 MIN
3.3750 g | Freq: Once | INTRAVENOUS | Status: AC
Start: 2017-12-01 — End: 2017-12-01
  Administered 2017-12-01: 3.375 g via INTRAVENOUS
  Filled 2017-12-01: qty 50

## 2017-12-01 MED ORDER — LEVOTHYROXINE SODIUM 75 MCG PO TABS
75.0000 ug | ORAL_TABLET | Freq: Every day | ORAL | Status: DC
  Administered 2017-12-02 – 2017-12-09 (×8): 75 ug via ORAL
  Filled 2017-12-01 (×8): qty 1

## 2017-12-01 MED ORDER — SODIUM CHLORIDE 0.9 % IV SOLN
500.0000 mg | INTRAVENOUS | Status: DC
  Administered 2017-12-01: 500 mg via INTRAVENOUS
  Filled 2017-12-01: qty 500

## 2017-12-01 MED ORDER — HYDROCORTISONE NA SUCCINATE PF 100 MG IJ SOLR
100.0000 mg | Freq: Once | INTRAMUSCULAR | Status: AC
  Administered 2017-12-01: 100 mg via INTRAVENOUS
  Filled 2017-12-01: qty 2

## 2017-12-01 NOTE — ED Notes (Signed)
Admitting MD to consult CCM

## 2017-12-01 NOTE — ED Triage Notes (Signed)
Pt presents for evaluation of N/V/D x 2 months. States was sent by GI doctor in [place]. Family reports she is unable to eat. Denies blood in stool or emesis. Pt appears pale. On 4L O2 chronic for COPD.

## 2017-12-01 NOTE — ED Notes (Signed)
Paged admitting MD regarding BP.

## 2017-12-01 NOTE — ED Provider Notes (Signed)
[facility] EMERGENCY DEPARTMENT Provider Note   CSN: [id number] Arrival date & time: 12/01/17  1021     History   Chief Complaint Chief Complaint  Patient presents with  . Emesis  . Diarrhea    HPI Julie Bird is a 52 y.o. female presenting for evaluation of vomiting and diarrhea.  Patient states for the past 2 months, she has had daily vomiting and diarrhea.  She vomits multiple times a day, no hematemesis.  She has 3 or more stools a day, they are nonbloody.  Described as yellow and mucousy.  She denies abdominal pain.  Has been unable to tolerate solid foods, but tolerating liquids without difficulty.  Decreased appetite.  States she has lost 120 pounds in the past 2 months.  She has been evaluated multiple times by her PCP and her gastroenterologist without diagnosis or plan.  Patient states her condition has been steadily worsening over the past several months, which brought her in today.  She denies fevers, chills, chest pain, shortness of breath, or abnormal urination.  She had a colonoscopy approximately 9 months ago, in which 7 polyps were removed.  She has a history of diverticulitis, but states this feels different.  She is not currently on any antibiotics.  HPI  Past Medical History:  Diagnosis Date  . Asthma   . COPD (chronic obstructive pulmonary disease) (HCC)   . Diverticulitis   . Hypertension     There are no active problems to display for this patient.   Past Surgical History:  Procedure Laterality Date  . CHOLECYSTECTOMY    . CYSTOSTOMY W/ BLADDER DILATION       OB History    Gravida  3   Para  3   Term  3   Preterm      AB      Living        SAB      TAB      Ectopic      Multiple      Live Births               Home Medications    Prior to Admission medications   Medication Sig Start Date End Date Taking? Authorizing Provider  albuterol (PROVENTIL) (2.5 MG/3ML) 0.083% nebulizer solution Take 2.5 mg by  nebulization 2 (two) times daily as needed for wheezing or shortness of breath.   Yes [provider]  benzonatate (TESSALON) 100 MG capsule Take 100 mg by mouth 3 (three) times daily as needed for cough.  08/22/17  Yes [provider]  Cholecalciferol (VITAMIN D3) 50000 units CAPS Take 1 capsule by mouth every Monday, Wednesday, and Friday.  09/16/17  Yes [provider]  CLARITIN 10 MG tablet Take 10 mg by mouth at bedtime. 11/30/17  Yes [provider]  clonazePAM (KLONOPIN) 2 MG tablet Take 2 mg by mouth 4 (four) times daily as needed for anxiety.  08/30/17  Yes [provider]  DALIRESP 500 MCG TABS tablet Take 500 mg by mouth daily. 08/27/17  Yes [provider]  ferrous sulfate 324 (65 Fe) MG TBEC TAKE 1 TABLET BY MOUTH TWICE A DAY 06/22/17  Yes [provider]  fluticasone (FLONASE) 50 MCG/ACT nasal spray Place 2 sprays into both nostrils daily.   Yes [provider]  folic acid (FOLVITE) 1 MG tablet Take 2 mg by mouth every morning. 09/10/17  Yes [provider]  furosemide (LASIX) 20 MG tablet Take 20  mg by mouth every morning. 09/05/17  Yes [provider]  levothyroxine (SYNTHROID, LEVOTHROID) 75 MCG tablet Take 75 mcg by mouth daily. 08/18/17  Yes [provider]  metFORMIN (GLUCOPHAGE) 500 MG tablet Take 500 mg by mouth at bedtime as needed (for high BGL).  09/21/17  Yes [provider]  montelukast (SINGULAIR) 10 MG tablet Take 10 mg by mouth at bedtime.  08/18/17  Yes [provider]  nystatin cream (MYCOSTATIN) Apply 1 application topically daily. UNDER BOTH BREASTS 08/24/17  Yes [provider]  potassium chloride (KLOR-CON) 8 MEQ tablet Take 24 mEq by mouth every morning. 09/16/17  Yes [provider]  pravastatin (PRAVACHOL) 80 MG tablet Take 80 mg by mouth at bedtime. 08/18/17  Yes [provider]  predniSONE (DELTASONE) 5 MG tablet Take 5 mg by  mouth every morning. 09/20/17  Yes [provider]  PROAIR HFA 108 (90 Base) MCG/ACT inhaler Inhale 1-2 puffs into the lungs every 4 (four) hours as needed for wheezing or shortness of breath.  09/16/17  Yes [provider]  promethazine (PHENERGAN) 25 MG tablet Take 25 mg by mouth every 8 (eight) hours as needed for nausea or vomiting.  09/09/17  Yes [provider]  ranitidine (ZANTAC) 300 MG tablet Take 300 mg by mouth 2 (two) times daily. 08/30/17  Yes [provider]  risperidone (RISPERDAL) 4 MG tablet Take 4 mg by mouth at bedtime. 08/29/17  Yes [provider]  sertraline (ZOLOFT) 100 MG tablet Take 100 mg by mouth 2 (two) times daily.  09/21/17  Yes [provider]  Monte Fantasia INHUB 500-50 MCG/DOSE AEPB Inhale 1 puff into the lungs 2 (two) times daily. 11/14/17  Yes [provider]  ciprofloxacin (CIPRO) 500 MG tablet Take 1 tablet (500 mg total) by mouth 2 (two) times daily. Patient not taking: Reported on 12/01/2017 09/28/17   Samuel Jester, DO  metroNIDAZOLE (FLAGYL) 500 MG tablet Take 1 tablet (500 mg total) by mouth 3 (three) times daily. Patient not taking: Reported on 12/01/2017 09/28/17   Samuel Jester, DO    Family History Family History  Problem Relation Age of Onset  . Diabetes Mother   . Hypertension Mother   . Alzheimer's disease Mother   . COPD Mother   . Emphysema Father   . Alzheimer's disease Father     Social History Social History   Tobacco Use  . Smoking status: Former Games developer  . Smokeless tobacco: Never Used  Substance Use Topics  . Alcohol use: No    Frequency: Never  . Drug use: No     Allergies   Codeine; Lortab [hydrocodone-acetaminophen]; and Morphine and related   Review of Systems Review of Systems  Constitutional: Positive for appetite change and unexpected weight change.  Gastrointestinal: Positive for diarrhea, nausea and vomiting.  All other systems reviewed and are  negative.    Physical Exam Updated Vital Signs BP (!) 86/46   Pulse 91   Temp 98.1 F (36.7 C) (Oral)   Resp (!) 21   Ht 5\' 5"  (1.651 m)   Wt 105.7 kg (233 lb)   SpO2 100%   BMI 38.77 kg/m   Physical Exam  Constitutional: She is oriented to person, place, and time. She appears well-developed and well-nourished.  Patient appears chronically ill and very pale.  HENT:  Head: Normocephalic and atraumatic.  Mouth/Throat: Mucous membranes are pale and dry.  Eyes: Pupils are equal, round, and reactive to light. Conjunctivae and EOM are  normal.  Neck: Normal range of motion. Neck supple.  Cardiovascular: Normal rate, regular rhythm and intact distal pulses.  Pulmonary/Chest: Effort normal and breath sounds normal. No respiratory distress. She has no wheezes.  Abdominal: Soft. Bowel sounds are normal. She exhibits no distension and no mass. There is tenderness. There is no rebound and no guarding.  TTP of LUQ and RUQ. No TTP elsewhere. abd is soft without rigidity, guarding, or distention.  Musculoskeletal: Normal range of motion.  Neurological: She is alert and oriented to person, place, and time.  Skin: Skin is warm and dry.  Psychiatric: She has a normal mood and affect.  Nursing note and vitals reviewed.    ED Treatments / Results  Labs (all labs ordered are listed, but only abnormal results are displayed) Labs Reviewed  COMPREHENSIVE METABOLIC PANEL - Abnormal; Notable for the following components:      Result Value   Sodium 126 (*)    Chloride 88 (*)    Glucose, Bld 115 (*)    Calcium 8.8 (*)    Total Protein 6.3 (*)    Albumin 2.9 (*)    ALT 12 (*)    All other components within normal limits  CBC WITH DIFFERENTIAL/PLATELET - Abnormal; Notable for the following components:   RBC 3.64 (*)    Hemoglobin 10.1 (*)    HCT 31.7 (*)    Platelets 120 (*)    Neutro Abs 7.8 (*)    All other components within normal limits  LIPASE, BLOOD - Abnormal; Notable for the  following components:   Lipase 91 (*)    All other components within normal limits  C DIFFICILE QUICK SCREEN W PCR REFLEX  CULTURE, BLOOD (ROUTINE X 2)  CULTURE, BLOOD (ROUTINE X 2)  GASTROINTESTINAL PANEL BY PCR, STOOL (REPLACES STOOL CULTURE)  URINE CULTURE  PROTIME-INR  URINALYSIS, ROUTINE W REFLEX MICROSCOPIC  I-STAT CG4 LACTIC ACID, ED  I-STAT CG4 LACTIC ACID, ED    EKG None  Radiology Ct Abdomen Pelvis W Contrast  Result Date: 12/01/2017 CLINICAL DATA:  Continued surveillance diverticulitis. Nausea, vomiting, and diarrhea for 4 months. EXAM: CT ABDOMEN AND PELVIS WITH CONTRAST TECHNIQUE: Multidetector CT imaging of the abdomen and pelvis was performed using the standard protocol following bolus administration of intravenous contrast. CONTRAST:  ISOVUE-300 IOPAMIDOL (ISOVUE-300) INJECTION 61% COMPARISON:  09/28/2017. FINDINGS: Lower chest: RIGHT lower lobe infiltrate. This is a new finding from February. This affects primarily the posterior basal segment of the RIGHT lower lobe. No significant effusion. LEFT lung clear. Hepatobiliary: Steatosis. No focal liver abnormality. Cholecystectomy. Pancreas: Unremarkable. No pancreatic ductal dilatation or surrounding inflammatory changes. Low Spleen: Splenomegaly.  No focal lesions. Adrenals/Urinary Tract: Adrenal glands are unremarkable. Kidneys are normal, without renal calculi, focal lesion, or hydronephrosis. Bladder is unremarkable. Stomach/Bowel: Mild changes of descending/sigmoid colon colitis, with bowel wall thickening and reduction in luminal caliber. Mild mesenteric edema. Small 1.5 cm microperforation is seen at the proximal sigmoid region, series 3, image 80, more characteristic of diverticulitis. The ascending and transverse colon demonstrate bowel wall edema, new/increased from priors, raising the question of diffuse colitis. Infectious and inflammatory causes of colitis should be considered. No bowel obstruction. No free air  or significant free fluid. Vascular/Lymphatic: Aortic atherosclerosis without enlarged abdominal or pelvic lymph nodes. Reproductive: Uterus and bilateral adnexa are unremarkable. Other: Small periumbilical paramedian hernia containing fat. Slight associated mesenteric edema. Musculoskeletal: No acute or significant osseous findings. IMPRESSION: Mild increase in diffuse bowel wall thickening affecting not only the  LEFT, but RIGHT and transverse colonic regions raising the question of diffuse colitis. Infectious and inflammatory causes should be considered. In the proximal descending region, there is a new microperforation, 1.5 cm in diameter, more characteristic of diverticulitis. Diverticulosis with diverticulitis was demonstrated on the previous scan, but may be mildly increased. Splenomegaly.  Hepatic steatosis. RIGHT lower lobe pneumonia, new from February. Electronically Signed   By: Elsie Stain M.D.   On: 12/01/2017 15:12   Dg Chest Port 1 View  Result Date: 12/01/2017 CLINICAL DATA:  Shortness of breath and cough EXAM: PORTABLE CHEST 1 VIEW COMPARISON:  09/28/2017 FINDINGS: The heart size and mediastinal contours are within normal limits. Both lungs are clear. The visualized skeletal structures are unremarkable. IMPRESSION: No active disease. Electronically Signed   By: Alcide Clever M.D.   On: 12/01/2017 12:34    Procedures .Critical Care Performed by: Alveria Apley, PA-C Authorized by: Alveria Apley, PA-C   Critical care provider statement:    Critical care time (minutes):  45   Critical care time was exclusive of:  Separately billable procedures and treating other patients and teaching time   Critical care was necessary to treat or prevent imminent or life-threatening deterioration of the following conditions:  Circulatory failure, dehydration and shock   Critical care was time spent personally by me on the following activities:  Development of treatment plan with patient or  surrogate, discussions with consultants, evaluation of patient's response to treatment, examination of patient, ordering and performing treatments and interventions, ordering and review of laboratory studies, ordering and review of radiographic studies, pulse oximetry, re-evaluation of patient's condition, review of old charts and obtaining history from patient or surrogate   I assumed direction of critical care for this patient from another provider in my specialty: no   Comments:     Pt presenting with critically low BP, requiring immediate initiation of fluids. >4 fluid bolus given.    (including critical care time)  Medications Ordered in ED Medications  piperacillin-tazobactam (ZOSYN) IVPB 3.375 g (has no administration in time range)  iopamidol (ISOVUE-300) 61 % injection (has no administration in time range)  lactated ringers bolus 1,000 mL (1,000 mLs Intravenous New Bag/Given 12/01/17 1551)  lactated ringers bolus 1,000 mL (0 mLs Intravenous Stopped 12/01/17 1420)    And  lactated ringers bolus 1,000 mL (0 mLs Intravenous Stopped 12/01/17 1534)    And  lactated ringers bolus 1,000 mL (0 mLs Intravenous Stopped 12/01/17 1420)    And  lactated ringers bolus 500 mL (0 mLs Intravenous Stopped 12/01/17 1534)  piperacillin-tazobactam (ZOSYN) IVPB 3.375 g (0 g Intravenous Stopped 12/01/17 1324)  iopamidol (ISOVUE-300) 61 % injection 100 mL (100 mLs Intravenous Contrast Given 12/01/17 1400)     Initial Impression / Assessment and Plan / ED Course  I have reviewed the triage vital signs and the nursing notes.  Pertinent labs & imaging results that were available during my care of the patient were reviewed by me and considered in my medical decision making (see chart for details).     Patient presenting for evaluation of 106-month history of nausea, vomiting, diarrhea.  Physical exam concerning, patient is hypotensive and tachycardic. She appears pale and dehydrated. Code sepsis called, as pt  meets SIRS criteria. I am also concerned about her hgb. Will obtain labs, UA, EKG, CXR, and stool sample. Will give fluids, and reassess. CT abd ordered. Case discussed with attending, Dr. Patria Mane evaluated the pt.  BP improving slightly with bolus. Labs show no  leukocytosis, lactate negative. At this time, doubt sepsis. Likely hypotension due to dehydration, Na/Cl low. Kidney fnx stable. Will continue fluid resuscitation.   BP stable ~90 systolic with fluid. c diff negative. CXR negative for acute findings.   CT abd shows RLL pna, generalized colitis, microperf of diverticula. No need for IR drainage. Doubt acute surgical abdomen. Will call for admission for continued fluids, iv abx, and reassessment of pt condition.  Case discussed with FMTS, and pt to be admitted.    Final Clinical Impressions(s) / ED Diagnoses   Final diagnoses:  Hypotension, unspecified hypotension type  Dehydration  Pneumonia of right lower lobe due to infectious organism [facility](HCC)  Perforated diverticulum    ED Discharge Orders    None       Alveria ApleyCaccavale, Mckinlee Dunk, PA-C 12/01/17 2039    Azalia Bilisampos, Kevin, MD 12/01/17 2321

## 2017-12-01 NOTE — ED Notes (Addendum)
edp aware of pt bp. EDP also aware pt only with output.

## 2017-12-01 NOTE — Progress Notes (Signed)
Pharmacy Antibiotic Note  Julie Bird is a 52 y.o. female admitted on 12/01/2017 with sepsis.  Pharmacy has been consulted for Zosyn dosing.  N/V/D x2 months- [place] to [place].  Plan: Zosyn 3.375g IV q8h EI Follow renal function, c/s, LOT  Height: 5\' 5"  (165.1 cm) Weight: 233 lb (105.7 kg) IBW/kg (Calculated) : 57  Temp (24hrs), Avg:97.9 F (36.6 C), Min:97.6 F (36.4 C), Max:98.1 F (36.7 C)  Recent Labs  Lab 12/01/17 1202  LATICACIDVEN 1.48    CrCl cannot be calculated (Patient's most recent lab result is older than the maximum 21 days allowed.).    Allergies  Allergen Reactions  . Codeine Shortness Of Breath  . Lortab [Hydrocodone-Acetaminophen] Shortness Of Breath  . Morphine And Related Shortness Of Breath    Antimicrobials this admission: Zosyn 4/11 >>   Dose adjustments this admission: n/a  Microbiology results: 4/11 BCx:  4/11 urine:  4/11 Cdiff: 4/11 GI panel PCR:  Thank you for allowing pharmacy to be a part of this patient's care. Chloee Tena D. Maya Scholer, PharmD, BCPS Clinical Pharmacist Clinical Phone for 12/01/2017 until 3:30pm: [phone] If after 3:30pm, please call main pharmacy at [phone] 12/01/2017 12:30 PM

## 2017-12-01 NOTE — H&P (Signed)
PULMONARY / CRITICAL CARE MEDICINE   Name: Julie Bird MRN: [id number] DOB: 06-03-1966    ADMISSION DATE:  12/01/2017 CONSULTATION DATE:  12/01/17  REFERRING MD:  Dr. [facility] Medicine teaching service)  CHIEF COMPLAINT:  Nausea, vomiting, and abdominal discomfort for 2 months  HISTORY OF PRESENT ILLNESS:   Julie Bird is a 52 y.o f with diabetes, hypertension, alzheimer's disease, and copd on 4L at home who presented with nausea, vomiting, and diarrhea for the past 2 months. The patient stated that she first started noticing these symptoms 9 months ago after the patient she had a colonoscopy done with removal of 7 polyps. She feels that she has lost upto 120lbs over the past few months and she has had decreased appetite and has only been eating 1/2 cup of yogurt daily. Patient stated that she went to her gastroenterogist a few days ago and was just told to drink gatorade.   Patient's blood pressure has been hypotensive, she has been tachypnic, afebrile, no tachycardia. Labs were remarkable for sodium of 126, mild lipase elevation at 91, UA pos for mod leukocytes and negative for nitrites. GI panel negative.   Chest xray without any consolidation, infiltrate, or effusion. CT abdomen showed increase in bowel wall thickening affecting left, right, and transverse colon. There was a new microperforation 1.5cm in diameter that was seen. There was also right lower lobe pneumonia noted.   PAST MEDICAL HISTORY :  She  has a past medical history of Asthma, COPD (chronic obstructive pulmonary disease) (HCC), Diverticulitis, and Hypertension.  PAST SURGICAL HISTORY: She  has a past surgical history that includes Cholecystectomy and Cystostomy w/ bladder dilation.  Allergies  Allergen Reactions  . Codeine Shortness Of Breath  . Lortab [Hydrocodone-Acetaminophen] Shortness Of Breath  . Morphine And Related Shortness Of Breath    No current facility-administered medications on file prior to  encounter.    Current Outpatient Medications on File Prior to Encounter  Medication Sig  . albuterol (PROVENTIL) (2.5 MG/3ML) 0.083% nebulizer solution Take 2.5 mg by nebulization 2 (two) times daily as needed for wheezing or shortness of breath.  . benzonatate (TESSALON) 100 MG capsule Take 100 mg by mouth 3 (three) times daily as needed for cough.   . Cholecalciferol (VITAMIN D3) 50000 units CAPS Take 1 capsule by mouth every Monday, Wednesday, and Friday.   Marland Kitchen CLARITIN 10 MG tablet Take 10 mg by mouth at bedtime.  . clonazePAM (KLONOPIN) 2 MG tablet Take 2 mg by mouth 4 (four) times daily as needed for anxiety.   Marland Kitchen DALIRESP 500 MCG TABS tablet Take 500 mg by mouth daily.  . ferrous sulfate 324 (65 Fe) MG TBEC TAKE 1 TABLET BY MOUTH TWICE A DAY  . fluticasone (FLONASE) 50 MCG/ACT nasal spray Place 2 sprays into both nostrils daily.  . folic acid (FOLVITE) 1 MG tablet Take 2 mg by mouth every morning.  . furosemide (LASIX) 20 MG tablet Take 20 mg by mouth every morning.  Marland Kitchen levothyroxine (SYNTHROID, LEVOTHROID) 75 MCG tablet Take 75 mcg by mouth daily.  . Liniments (MUSCLE RUB MAXIMUM STRENGTH EX) Apply to affected areas up to two times a day- left shoulder and both knees  . metFORMIN (GLUCOPHAGE) 500 MG tablet Take 500 mg by mouth at bedtime as needed (for high BGL).   Marland Kitchen montelukast (SINGULAIR) 10 MG tablet Take 10 mg by mouth at bedtime.   Marland Kitchen nystatin cream (MYCOSTATIN) Apply 1 application topically daily. UNDER BOTH BREASTS  . potassium chloride (  KLOR-CON) 8 MEQ tablet Take 24 mEq by mouth every morning.  . pravastatin (PRAVACHOL) 80 MG tablet Take 80 mg by mouth at bedtime.  . predniSONE (DELTASONE) 5 MG tablet Take 5 mg by mouth every morning.  Marland Kitchen PROAIR HFA 108 (90 Base) MCG/ACT inhaler Inhale 1-2 puffs into the lungs every 4 (four) hours as needed for wheezing or shortness of breath.   . promethazine (PHENERGAN) 25 MG tablet Take 25 mg by mouth every 8 (eight) hours as needed for nausea or  vomiting.   . ranitidine (ZANTAC) 300 MG tablet Take 300 mg by mouth 2 (two) times daily.  . risperidone (RISPERDAL) 4 MG tablet Take 4 mg by mouth at bedtime.  . sertraline (ZOLOFT) 100 MG tablet Take 100 mg by mouth 2 (two) times daily.   Monte Fantasia INHUB 500-50 MCG/DOSE AEPB Inhale 1 puff into the lungs 2 (two) times daily.  . ciprofloxacin (CIPRO) 500 MG tablet Take 1 tablet (500 mg total) by mouth 2 (two) times daily. (Patient not taking: Reported on 12/01/2017)  . metroNIDAZOLE (FLAGYL) 500 MG tablet Take 1 tablet (500 mg total) by mouth 3 (three) times daily. (Patient not taking: Reported on 12/01/2017)    FAMILY HISTORY:  Her indicated that the status of her mother is unknown. She indicated that the status of her father is unknown.   SOCIAL HISTORY: She  reports that she has quit smoking. She has never used smokeless tobacco. She reports that she does not drink alcohol or use drugs.  REVIEW OF SYSTEMS:   Denies dysuria, urgency, hematuria, cough, chest pain  VITAL SIGNS: BP (!) 88/45   Pulse 97   Temp 98.1 F (36.7 C) (Oral)   Resp (!) 22   Ht 5\' 5"  (1.651 m)   Wt 233 lb (105.7 kg)   SpO2 100%   BMI 38.77 kg/m   HEMODYNAMICS: Hypotensive- systolic 70-90, diastolic 30-40s Placed on phenylephrine (neo)  INTAKE / OUTPUT: No intake/output data recorded.  PHYSICAL EXAMINATION: Physical Exam  Constitutional: She appears well-developed and well-nourished. No distress.  HENT:  Head: Normocephalic and atraumatic.  Mouth/Throat: Oropharynx is clear and moist.  Dried blood visualized in bilateral nares, dried blood present over upper and lower lips  Eyes: Conjunctivae are normal.  Cardiovascular: Normal rate, regular rhythm and normal heart sounds.  No murmur heard. Respiratory: Effort normal and breath sounds normal. No respiratory distress. She has no wheezes.  GI: Soft. Bowel sounds are normal. She exhibits no distension. There is tenderness (mild amount diffusely. most  evident in ruq).  Musculoskeletal: She exhibits no edema.  Neurological: She is alert.  Skin: Skin is dry. She is not diaphoretic. No erythema.  Psychiatric: She has a normal mood and affect. Her behavior is normal. Judgment and thought content normal.   LABS:  BMET Recent Labs  Lab 12/01/17 1132  NA 126*  K 4.2  CL 88*  CO2 24  BUN 10  CREATININE 0.73  GLUCOSE 115*    Electrolytes Recent Labs  Lab 12/01/17 1132  CALCIUM 8.8*    CBC Recent Labs  Lab 12/01/17 1132  WBC 9.5  HGB 10.1*  HCT 31.7*  PLT 120*    Coag's Recent Labs  Lab 12/01/17 1132  INR 1.06    Sepsis Markers Recent Labs  Lab 12/01/17 1202 12/01/17 1526  LATICACIDVEN 1.48 1.24    ABG No results for input(s): PHART, PCO2ART, PO2ART in the last 168 hours.  Liver Enzymes Recent Labs  Lab 12/01/17 1132  AST 23  ALT 12*  ALKPHOS 100  BILITOT 0.7  ALBUMIN 2.9*    Cardiac Enzymes No results for input(s): TROPONINI, PROBNP in the last 168 hours.  Glucose No results for input(s): GLUCAP in the last 168 hours.  Imaging Ct Abdomen Pelvis W Contrast  Result Date: 12/01/2017 CLINICAL DATA:  Continued surveillance diverticulitis. Nausea, vomiting, and diarrhea for 4 months. EXAM: CT ABDOMEN AND PELVIS WITH CONTRAST TECHNIQUE: Multidetector CT imaging of the abdomen and pelvis was performed using the standard protocol following bolus administration of intravenous contrast. CONTRAST:  100mL ISOVUE-300 IOPAMIDOL (ISOVUE-300) INJECTION 61% COMPARISON:  09/28/2017. FINDINGS: Lower chest: RIGHT lower lobe infiltrate. This is a new finding from February. This affects primarily the posterior basal segment of the RIGHT lower lobe. No significant effusion. LEFT lung clear. Hepatobiliary: Steatosis. No focal liver abnormality. Cholecystectomy. Pancreas: Unremarkable. No pancreatic ductal dilatation or surrounding inflammatory changes. Low Spleen: Splenomegaly.  No focal lesions. Adrenals/Urinary Tract:  Adrenal glands are unremarkable. Kidneys are normal, without renal calculi, focal lesion, or hydronephrosis. Bladder is unremarkable. Stomach/Bowel: Mild changes of descending/sigmoid colon colitis, with bowel wall thickening and reduction in luminal caliber. Mild mesenteric edema. Small 1.5 cm microperforation is seen at the proximal sigmoid region, series 3, image 80, more characteristic of diverticulitis. The ascending and transverse colon demonstrate bowel wall edema, new/increased from priors, raising the question of diffuse colitis. Infectious and inflammatory causes of colitis should be considered. No bowel obstruction. No free air or significant free fluid. Vascular/Lymphatic: Aortic atherosclerosis without enlarged abdominal or pelvic lymph nodes. Reproductive: Uterus and bilateral adnexa are unremarkable. Other: Small periumbilical paramedian hernia containing fat. Slight associated mesenteric edema. Musculoskeletal: No acute or significant osseous findings. IMPRESSION: Mild increase in diffuse bowel wall thickening affecting not only the LEFT, but RIGHT and transverse colonic regions raising the question of diffuse colitis. Infectious and inflammatory causes should be considered. In the proximal descending region, there is a new microperforation, 1.5 cm in diameter, more characteristic of diverticulitis. Diverticulosis with diverticulitis was demonstrated on the previous scan, but may be mildly increased. Splenomegaly.  Hepatic steatosis. RIGHT lower lobe pneumonia, new from February. Electronically Signed   By: Elsie StainJohn T Curnes M.D.   On: 12/01/2017 15:12   Dg Chest Port 1 View  Result Date: 12/01/2017 CLINICAL DATA:  Shortness of breath and cough EXAM: PORTABLE CHEST 1 VIEW COMPARISON:  09/28/2017 FINDINGS: The heart size and mediastinal contours are within normal limits. Both lungs are clear. The visualized skeletal structures are unremarkable. IMPRESSION: No active disease. Electronically Signed    By: Alcide CleverMark  Lukens M.D.   On: 12/01/2017 12:34   STUDIES:  Chest xray (4/11): no infiltrate or consolidation Abdominal CT (4/11): increase in bowel wall thickening affecting left, right, and transverse colon. There was a new microperforation 1.5cm in diameter that was seen.  CULTURES: Blood culture (4/11) pending Urine culture (4/11) pending  ANTIBIOTICS: Zosyn (12/01/17)>  SIGNIFICANT EVENTS: Admission to ICU  LINES/TUBES: Peripheral IV in left and right antecubital  ASSESSMENT / PLAN:  PULMONARY A:  Hx of COPD on home 4L   P:   Discontinue zosyn and azithromycin as patient is afebrile, no leukocytosis, procalcitonin negative, and patient's ct abdomen is more likely consistent with pulmonary scarring.   Proventil bid prn Dulera 2puffs bid Singulair 10mg  qhs  CARDIOVASCULAR A:  Hypotension Caution for QT prolongation due to heavy loperamide use along with risperidol and zoloft  P:  Phenylephrine (neo) gtt Telemetry  RENAL A:   Hypovolemic hyponatremia possible secondary  to GI losses vs psychiatric.  P:   Urine na, urine osm, serum, osm pending to calculate fena Repeat bmp in 2 hrs and subsequently q4hrs 6L LR bolus given and followed by   GASTROINTESTINAL A:    Diverticulitis with new microperforation Likely Secretory diarrhea Possible gastroparesis  P:   GI panel negative for all  C dif negative  Surgery consulted Stool osm and serum osm pending GI consult to scope patient  HEMATOLOGIC A:   Anemia of chronic disease (hb=10.1, hct=31.7, mcv=87)  P:  Monitor cbc Smear saved for review  Iron and TIBC pending Ferritin pending  INFECTIOUS A:    Thrush likely secondary to steroid inhaler use  P:   Urinalysis positive for moderate leukocytes, negative nitirites. Afebrile and no leukocytosis Blood culture and urine culture pending C dificile negative GI panel negative Lactic acid normal at 1.48, 1.24 Procalcitonin pending Discontinue Zosyn  and azithromycin as pt unlikely to have pneumonia and ua also unimpressive Nystatin swish and swallow HIV to check for possible immunocompromise secondary to thrush  ENDOCRINE A:   Hx. of diabetes mellitus on metformin 500 qhs,  Hx of Hypothyroidism on levothyroxine at home   P:   HbA1C pending SSI Held metformin Cortisol 17.8 so unlikely to be adrenal insufficiency   NEUROLOGIC A:   Anxiety and depression on risperdal and zoloft at home  P:   Held risperidal and zoloft   FAMILY  - Updates: Patient's family at bedside and notified that patient will be admitted and informed of updates as more information is received.    Pulmonary and Critical Care Medicine [facility] Pager: [phone]  12/01/2017, 8:49 PM

## 2017-12-01 NOTE — ED Notes (Signed)
EDP came to CT with this RN to reassess pt due to hypotension.

## 2017-12-01 NOTE — Consult Note (Signed)
Reason for Consult:colitis Referring Physician: Lovenia Kim  Julie Bird is an 52 y.o. female.  HPI: 52yo F with a history of O2 dependent COPD C/O nausea and diarrhea since October.  This has been worse for the past 2 months.  She is followed by a GI doctor in [place].  She reports having a colonoscopy with polypectomies done last fall.  She describes gagging frequently when she eats.  She also has frequent diarrhea and reports to have lost 120 pounds over the past couple of months.  She denies any blood in her stool and has not had significant abdominal pain.  Her symptoms persisted and she was recommended to come to the emergency department at [facility].  Evaluation here included labs showing hyponatremia, normal white blood cell count, lactate 1.24.  She underwent CT scan of the abdomen and pelvis which showed diffuse pancolonic thickening consistent with colitis with a questionable microperforation in the proximal descending colon.  I was asked to see her from a surgical standpoint.  She was initially admitted by the family practice teaching service, however she has been hypotensive in the emergency department and the critical care medicine service has been consulted.  Past Medical History:  Diagnosis Date  . Asthma   . COPD (chronic obstructive pulmonary disease) ([place])   . Diverticulitis   . Hypertension     Past Surgical History:  Procedure Laterality Date  . CHOLECYSTECTOMY    . CYSTOSTOMY W/ BLADDER DILATION      Family History  Problem Relation Age of Onset  . Diabetes Mother   . Hypertension Mother   . Alzheimer's disease Mother   . COPD Mother   . Emphysema Father   . Alzheimer's disease Father     Social History:  reports that she has quit smoking. She has never used smokeless tobacco. She reports that she does not drink alcohol or use drugs.  Allergies:  Allergies  Allergen Reactions  . Codeine Shortness Of Breath  . Lortab [Hydrocodone-Acetaminophen]  Shortness Of Breath  . Morphine And Related Shortness Of Breath    Medications: I have reviewed the patient's current medications.  Results for orders placed or performed during the hospital encounter of 12/01/17 (from the past 48 hour(s))  Comprehensive metabolic panel     Status: Abnormal   Collection Time: 12/01/17 11:32 AM  Result Value Ref Range   Sodium 126 (L) 135 - 145 mmol/L   Potassium 4.2 3.5 - 5.1 mmol/L   Chloride 88 (L) 101 - 111 mmol/L   CO2 24 22 - 32 mmol/L   Glucose, Bld 115 (H) 65 - 99 mg/dL   BUN 10 6 - 20 mg/dL   Creatinine, Ser 0.73 0.44 - 1.00 mg/dL   Calcium 8.8 (L) 8.9 - 10.3 mg/dL   Total Protein 6.3 (L) 6.5 - 8.1 g/dL   Albumin 2.9 (L) 3.5 - 5.0 g/dL   AST 23 15 - 41 U/L   ALT 12 (L) 14 - 54 U/L   Alkaline Phosphatase 100 38 - 126 U/L   Total Bilirubin 0.7 0.3 - 1.2 mg/dL   GFR calc non Af Amer >60 >60 mL/min   GFR calc Af Amer >60 >60 mL/min    Comment: (NOTE) The eGFR has been calculated using the CKD EPI equation. This calculation has not been validated in all clinical situations. eGFR's persistently <60 mL/min signify possible Chronic Kidney Disease.    Anion gap 14 5 - 15    Comment: Performed at [facility], [place] [address], [place], [place] [address]  CBC with Differential     Status: Abnormal   Collection Time: 12/01/17 11:32 AM  Result Value Ref Range   WBC 9.5 4.0 - 10.5 K/uL   RBC 3.64 (L) 3.87 - 5.11 MIL/uL   Hemoglobin 10.1 (L) 12.0 - 15.0 g/dL   HCT 31.7 (L) 36.0 - 46.0 %   MCV 87.1 78.0 - 100.0 fL   MCH 27.7 26.0 - 34.0 pg   MCHC 31.9 30.0 - 36.0 g/dL   RDW 14.0 11.5 - 15.5 %   Platelets 120 (L) 150 - 400 K/uL   Neutrophils Relative % 83 %   Neutro Abs 7.8 (H) 1.7 - 7.7 K/uL   Lymphocytes Relative 13 %   Lymphs Abs 1.3 0.7 - 4.0 K/uL   Monocytes Relative 4 %   Monocytes Absolute 0.4 0.1 - 1.0 K/uL   Eosinophils Relative 0 %   Eosinophils Absolute 0.0 0.0 - 0.7 K/uL   Basophils Relative 0 %   Basophils Absolute 0.0  0.0 - 0.1 K/uL    Comment: Performed at [facility] [address], [place], [place] [address]  Protime-INR     Status: None   Collection Time: 12/01/17 11:32 AM  Result Value Ref Range   Prothrombin Time 13.7 11.4 - 15.2 seconds   INR 1.06     Comment: Performed at [facility], [place] [address], [place], [place] [address]  Lipase, blood     Status: Abnormal   Collection Time: 12/01/17 11:32 AM  Result Value Ref Range   Lipase 91 (H) 11 - 51 U/L    Comment: Performed at [facility] [address], [place], [place] [address]  I-Stat CG4 Lactic Acid, ED     Status: None   Collection Time: 12/01/17 12:02 PM  Result Value Ref Range   Lactic Acid, Venous 1.48 0.5 - 1.9 mmol/L  C difficile quick scan w PCR reflex     Status: None   Collection Time: 12/01/17 12:55 PM  Result Value Ref Range   C Diff antigen NEGATIVE NEGATIVE   C Diff toxin NEGATIVE NEGATIVE   C Diff interpretation No C. difficile detected.     Comment: Performed at [facility], [place] [address] [place], [place] [address]  Gastrointestinal Panel by PCR , Stool     Status: None   Collection Time: 12/01/17 12:55 PM  Result Value Ref Range   Campylobacter species NOT DETECTED NOT DETECTED   Plesimonas shigelloides NOT DETECTED NOT DETECTED   Salmonella species NOT DETECTED NOT DETECTED   Yersinia enterocolitica NOT DETECTED NOT DETECTED   Vibrio species NOT DETECTED NOT DETECTED   Vibrio cholerae NOT DETECTED NOT DETECTED   Enteroaggregative E coli (EAEC) NOT DETECTED NOT DETECTED   Enteropathogenic E coli (EPEC) NOT DETECTED NOT DETECTED   Enterotoxigenic E coli (ETEC) NOT DETECTED NOT DETECTED   Shiga like toxin producing E coli (STEC) NOT DETECTED NOT DETECTED   Shigella/Enteroinvasive E coli (EIEC) NOT DETECTED NOT DETECTED   Cryptosporidium NOT DETECTED NOT DETECTED   Cyclospora cayetanensis NOT DETECTED NOT DETECTED   Entamoeba histolytica NOT DETECTED NOT DETECTED   Giardia lamblia NOT  DETECTED NOT DETECTED   Adenovirus F40/41 NOT DETECTED NOT DETECTED   Astrovirus NOT DETECTED NOT DETECTED   Norovirus GI/GII NOT DETECTED NOT DETECTED   Rotavirus A NOT DETECTED NOT DETECTED   Sapovirus (I, II, IV, and V) NOT  DETECTED NOT DETECTED    Comment: Performed at [facility], [place] [address] [place], [place] [address]  I-Stat CG4 Lactic Acid, ED     Status: None   Collection Time: 12/01/17  3:26 PM  Result Value Ref Range   Lactic Acid, Venous 1.24 0.5 - 1.9 mmol/L  Urinalysis, Routine w reflex microscopic     Status: Abnormal   Collection Time: 12/01/17  4:09 PM  Result Value Ref Range   Color, Urine YELLOW YELLOW   APPearance CLEAR CLEAR   Specific Gravity, Urine >1.046 (H) 1.005 - 1.030   pH 5.0 5.0 - 8.0   Glucose, UA NEGATIVE NEGATIVE mg/dL   Hgb urine dipstick SMALL (A) NEGATIVE   Bilirubin Urine NEGATIVE NEGATIVE   Ketones, ur NEGATIVE NEGATIVE mg/dL   Protein, ur NEGATIVE NEGATIVE mg/dL   Nitrite NEGATIVE NEGATIVE   Leukocytes, UA MODERATE (A) NEGATIVE   RBC / HPF 0-5 0 - 5 RBC/hpf   WBC, UA TOO NUMEROUS TO COUNT 0 - 5 WBC/hpf   Bacteria, UA RARE (A) NONE SEEN   Squamous Epithelial / LPF 0-5 (A) NONE SEEN    Comment: Performed at [facility], [address] [place],  [address]  Procalcitonin - Baseline     Status: None   Collection Time: 12/01/17  7:32 PM  Result Value Ref Range   Procalcitonin 0.51 ng/mL    Comment:        Interpretation: PCT > 0.5 ng/mL and <= 2 ng/mL: Systemic infection (sepsis) is possible, but other conditions are known to elevate PCT as well. (NOTE)       Sepsis PCT Algorithm           Lower Respiratory Tract                                      Infection PCT Algorithm    ----------------------------     ----------------------------         PCT < 0.25 ng/mL                PCT < 0.10 ng/mL         Strongly encourage             Strongly discourage   discontinuation of antibiotics    initiation of  antibiotics    ----------------------------     -----------------------------       PCT 0.25 - 0.50 ng/mL            PCT 0.10 - 0.25 ng/mL               OR       >80% decrease in PCT            Discourage initiation of                                            antibiotics      Encourage discontinuation           of antibiotics    ----------------------------     -----------------------------         PCT >= 0.50 ng/mL              PCT 0.26 - 0.50 ng/mL  AND       <80% decrease in PCT             Encourage initiation of                                             antibiotics       Encourage continuation           of antibiotics    ----------------------------     -----------------------------        PCT >= 0.50 ng/mL                  PCT > 0.50 ng/mL               AND         increase in PCT                  Strongly encourage                                      initiation of antibiotics    Strongly encourage escalation           of antibiotics                                     -----------------------------                                           PCT <= 0.25 ng/mL                                                 OR                                        > 80% decrease in PCT                                     Discontinue / Do not initiate                                             antibiotics Performed at [facility], [place] [address], [place], [place] [address]   Cortisol     Status: None   Collection Time: 12/01/17  8:01 PM  Result Value Ref Range   Cortisol, Plasma 17.8 ug/dL    Comment: (NOTE) AM    6.7 - 22.6 ug/dL PM   <10.0       ug/dL Performed at [facility] [address], [place], [place] [address]     Ct Abdomen Pelvis W Contrast  Result Date: 12/01/2017 CLINICAL DATA:  Continued surveillance diverticulitis. Nausea, vomiting, and diarrhea for 4  months. EXAM: CT ABDOMEN AND PELVIS WITH CONTRAST TECHNIQUE: Multidetector CT  imaging of the abdomen and pelvis was performed using the standard protocol following bolus administration of intravenous contrast. CONTRAST:  176m ISOVUE-300 IOPAMIDOL (ISOVUE-300) INJECTION 61% COMPARISON:  09/28/2017. FINDINGS: Lower chest: RIGHT lower lobe infiltrate. This is a new finding from February. This affects primarily the posterior basal segment of the RIGHT lower lobe. No significant effusion. LEFT lung clear. Hepatobiliary: Steatosis. No focal liver abnormality. Cholecystectomy. Pancreas: Unremarkable. No pancreatic ductal dilatation or surrounding inflammatory changes. Low Spleen: Splenomegaly.  No focal lesions. Adrenals/Urinary Tract: Adrenal glands are unremarkable. Kidneys are normal, without renal calculi, focal lesion, or hydronephrosis. Bladder is unremarkable. Stomach/Bowel: Mild changes of descending/sigmoid colon colitis, with bowel wall thickening and reduction in luminal caliber. Mild mesenteric edema. Small 1.5 cm microperforation is seen at the proximal sigmoid region, series 3, image 80, more characteristic of diverticulitis. The ascending and transverse colon demonstrate bowel wall edema, new/increased from priors, raising the question of diffuse colitis. Infectious and inflammatory causes of colitis should be considered. No bowel obstruction. No free air or significant free fluid. Vascular/Lymphatic: Aortic atherosclerosis without enlarged abdominal or pelvic lymph nodes. Reproductive: Uterus and bilateral adnexa are unremarkable. Other: Small periumbilical paramedian hernia containing fat. Slight associated mesenteric edema. Musculoskeletal: No acute or significant osseous findings. IMPRESSION: Mild increase in diffuse bowel wall thickening affecting not only the LEFT, but RIGHT and transverse colonic regions raising the question of diffuse colitis. Infectious and inflammatory causes should be considered. In the proximal descending region, there is a new microperforation, 1.5 cm in  diameter, more characteristic of diverticulitis. Diverticulosis with diverticulitis was demonstrated on the previous scan, but may be mildly increased. Splenomegaly.  Hepatic steatosis. RIGHT lower lobe pneumonia, new from February. Electronically Signed   By: JStaci RighterM.D.   On: 12/01/2017 15:12   Dg Chest Port 1 View  Result Date: 12/01/2017 CLINICAL DATA:  Shortness of breath and cough EXAM: PORTABLE CHEST 1 VIEW COMPARISON:  09/28/2017 FINDINGS: The heart size and mediastinal contours are within normal limits. Both lungs are clear. The visualized skeletal structures are unremarkable. IMPRESSION: No active disease. Electronically Signed   By: MInez CatalinaM.D.   On: 12/01/2017 12:34    Review of Systems  Constitutional: Negative for chills and fever.  HENT: Negative.   Eyes: Negative for blurred vision.  Respiratory: Negative for cough and shortness of breath.   Cardiovascular: Negative for chest pain.  Gastrointestinal: Positive for diarrhea, nausea and vomiting. Negative for constipation.  Genitourinary: Negative.   Musculoskeletal: Negative.   Skin: Negative.   Neurological: Negative.   Endo/Heme/Allergies: Negative.   Psychiatric/Behavioral: Negative.    Blood pressure (!) 87/49, pulse 94, temperature 98.1 F (36.7 C), temperature source Oral, resp. rate (!) 21, height 5' 5"  (1.651 m), weight 105.7 kg (233 lb), SpO2 98 %. Physical Exam  Constitutional: She is oriented to person, place, and time. She appears well-developed and well-nourished. No distress.  HENT:  Head: Normocephalic and atraumatic.  Right Ear: External ear normal.  Left Ear: External ear normal.  Nose: Nose normal.  Eyes: Pupils are equal, round, and reactive to light. EOM are normal.  Neck: Neck supple. No thyromegaly present.  Cardiovascular: Normal rate and regular rhythm.  Respiratory: Effort normal and breath sounds normal. No respiratory distress. She has no wheezes.  GI: Soft. She exhibits no  distension. There is tenderness. There is no rebound and no guarding.  Minimal right-sided abdominal pain to deep palpation  Musculoskeletal: Normal range of motion.  Neurological: She is alert and oriented to person, place, and time.  Skin: Skin is warm.  Psychiatric: She has a normal mood and affect.    Assessment/Plan: Diffuse colitis with possible microperforation - normal white count and lactate.  Agree with volume resuscitation and CCM consult.  Recommend medical management including bowel rest and IV antibiotics.  Zosyn has been started.  No need for emergent surgery.  We will follow along with you.  Zenovia Jarred 12/01/2017, 10:03 PM

## 2017-12-01 NOTE — Progress Notes (Signed)
eLink Physician-Brief Progress Note Patient Name: Julie Bird DOB: April 02, 1966 MRN: [id number]   Date of Service  12/01/2017  HPI/Events of Note  Call from [facility] to [facility] - 2 mo N/V. CT with colitis. Still hypotensive ater 4L fluids. Normal WBC, No fever. Just tachycardic. Normal lactate  eICU Interventions  recommened 2 more liters chect stat PCT and cortisol -> after blood draw -> do hydrocort 100mg  IV x 1 Start neo  Ccm will evaluate in due course ([facility])     Intervention Category Major Interventions: Shock - evaluation and management  Kyriakos Babler 12/01/2017, 7:30 PM

## 2017-12-01 NOTE — Progress Notes (Addendum)
Patient has remained persistently hypotensive despite 4.5 L boluses of LR given in ED.  Repeat blood pressures consistently ranging 70-80/30's and patient placed in Trendelenburg position.   Per RN, last MAPs>65 were 2:30 this afternoon.  She remains afebrile with HR in 90's and satting 100% on her home 4L O2 per .    Admit orders initially placed for stepdown, however concern she may not be stepdown-appropriate with her hypotension and may need pressors to maintain.  Called [facility] at 730 PM and spoke with Dr. Marchelle Gearingamaswamy.  Have placed orders for another 2L bolus of lactated ringers, procalcitonin and random cortisol.  Will follow with IV 100 mg Solu-Cortef x1 and monitor pressures closely.  Neosynephrine started per Dr. Marchelle Gearingamaswamy, and CCM to evaluate.  Plan of care discussed with RN Crystal.    Freddrick MarchYashika Amin MD [facility], PGY-2

## 2017-12-02 ENCOUNTER — Inpatient Hospital Stay (HOSPITAL_COMMUNITY): Payer: Medicaid - Out of State

## 2017-12-02 ENCOUNTER — Other Ambulatory Visit: Payer: Self-pay

## 2017-12-02 DIAGNOSIS — D649 Anemia, unspecified: Secondary | ICD-10-CM | POA: Diagnosis present

## 2017-12-02 DIAGNOSIS — K760 Fatty (change of) liver, not elsewhere classified: Secondary | ICD-10-CM | POA: Diagnosis present

## 2017-12-02 DIAGNOSIS — D72829 Elevated white blood cell count, unspecified: Secondary | ICD-10-CM | POA: Diagnosis present

## 2017-12-02 DIAGNOSIS — R7989 Other specified abnormal findings of blood chemistry: Secondary | ICD-10-CM | POA: Diagnosis present

## 2017-12-02 DIAGNOSIS — E43 Unspecified severe protein-calorie malnutrition: Secondary | ICD-10-CM

## 2017-12-02 DIAGNOSIS — D696 Thrombocytopenia, unspecified: Secondary | ICD-10-CM

## 2017-12-02 DIAGNOSIS — E871 Hypo-osmolality and hyponatremia: Secondary | ICD-10-CM | POA: Diagnosis present

## 2017-12-02 DIAGNOSIS — I5030 Unspecified diastolic (congestive) heart failure: Secondary | ICD-10-CM

## 2017-12-02 DIAGNOSIS — R8281 Pyuria: Secondary | ICD-10-CM | POA: Diagnosis present

## 2017-12-02 DIAGNOSIS — K5720 Diverticulitis of large intestine with perforation and abscess without bleeding: Secondary | ICD-10-CM | POA: Diagnosis present

## 2017-12-02 DIAGNOSIS — K529 Noninfective gastroenteritis and colitis, unspecified: Secondary | ICD-10-CM | POA: Diagnosis present

## 2017-12-02 DIAGNOSIS — L8990 Pressure ulcer of unspecified site, unspecified stage: Secondary | ICD-10-CM | POA: Diagnosis present

## 2017-12-02 DIAGNOSIS — Z8719 Personal history of other diseases of the digestive system: Secondary | ICD-10-CM

## 2017-12-02 DIAGNOSIS — E8809 Other disorders of plasma-protein metabolism, not elsewhere classified: Secondary | ICD-10-CM | POA: Diagnosis present

## 2017-12-02 LAB — BASIC METABOLIC PANEL
ANION GAP: 11 (ref 5–15)
Anion gap: 13 (ref 5–15)
Anion gap: 10 (ref 5–15)
Anion gap: 11 (ref 5–15)
Anion gap: 10 (ref 5–15)
BUN: 8 mg/dL (ref 6–20)
BUN: 8 mg/dL (ref 6–20)
BUN: 6 mg/dL (ref 6–20)
BUN: 7 mg/dL (ref 6–20)
BUN: 8 mg/dL (ref 6–20)
CALCIUM: 7.6 mg/dL — AB (ref 8.9–10.3)
CALCIUM: 7.5 mg/dL — AB (ref 8.9–10.3)
CALCIUM: 7.6 mg/dL — AB (ref 8.9–10.3)
CHLORIDE: 94 mmol/L — AB (ref 101–111)
CHLORIDE: 96 mmol/L — AB (ref 101–111)
CO2: 19 mmol/L — AB (ref 22–32)
CO2: 22 mmol/L (ref 22–32)
CO2: 23 mmol/L (ref 22–32)
CO2: 23 mmol/L (ref 22–32)
CO2: 23 mmol/L (ref 22–32)
CREATININE: 0.46 mg/dL (ref 0.44–1.00)
CREATININE: 0.47 mg/dL (ref 0.44–1.00)
CREATININE: 0.47 mg/dL (ref 0.44–1.00)
Calcium: 8.2 mg/dL — ABNORMAL LOW (ref 8.9–10.3)
Calcium: 7.5 mg/dL — ABNORMAL LOW (ref 8.9–10.3)
Chloride: 98 mmol/L — ABNORMAL LOW (ref 101–111)
Chloride: 102 mmol/L (ref 101–111)
Chloride: 99 mmol/L — ABNORMAL LOW (ref 101–111)
Creatinine, Ser: 0.52 mg/dL (ref 0.44–1.00)
Creatinine, Ser: 0.49 mg/dL (ref 0.44–1.00)
GFR calc Af Amer: >60 mL/min (ref >60)
GFR calc Af Amer: >60 mL/min (ref >60)
GFR calc non Af Amer: >60 mL/min (ref >60)
GFR calc non Af Amer: >60 mL/min (ref >60)
GLUCOSE: 106 mg/dL — AB (ref 65–99)
GLUCOSE: 109 mg/dL — AB (ref 65–99)
GLUCOSE: 89 mg/dL (ref 65–99)
GLUCOSE: 95 mg/dL (ref 65–99)
Glucose, Bld: 93 mg/dL (ref 65–99)
Potassium: 3.9 mmol/L (ref 3.5–5.1)
Potassium: 3.6 mmol/L (ref 3.5–5.1)
Potassium: 3.4 mmol/L — ABNORMAL LOW (ref 3.5–5.1)
Potassium: 4.9 mmol/L (ref 3.5–5.1)
Potassium: 3.2 mmol/L — ABNORMAL LOW (ref 3.5–5.1)
SODIUM: 132 mmol/L — AB (ref 135–145)
Sodium: 129 mmol/L — ABNORMAL LOW (ref 135–145)
Sodium: 129 mmol/L — ABNORMAL LOW (ref 135–145)
Sodium: 132 mmol/L — ABNORMAL LOW (ref 135–145)
Sodium: 132 mmol/L — ABNORMAL LOW (ref 135–145)

## 2017-12-02 LAB — SAVE SMEAR

## 2017-12-02 LAB — FERRITIN: Ferritin: 333 ng/mL — ABNORMAL HIGH (ref 11–307)

## 2017-12-02 LAB — LACTOFERRIN, FECAL, QUALITATIVE: Lactoferrin, Fecal, Qual: POSITIVE — AB

## 2017-12-02 LAB — HEMOGLOBIN A1C
HEMOGLOBIN A1C: 5.4 % (ref 4.8–5.6)
Mean Plasma Glucose: 108.28 mg/dL

## 2017-12-02 LAB — CBC
HEMATOCRIT: 29.6 % — AB (ref 36.0–46.0)
HEMOGLOBIN: 9.3 g/dL — AB (ref 12.0–15.0)
MCH: 27.8 pg (ref 26.0–34.0)
MCHC: 31.4 g/dL (ref 30.0–36.0)
MCV: 88.6 fL (ref 78.0–100.0)
Platelets: 141 10*3/uL — ABNORMAL LOW (ref 150–400)
RBC: 3.34 MIL/uL — ABNORMAL LOW (ref 3.87–5.11)
RDW: 14.3 % (ref 11.5–15.5)
WBC: 12.7 10*3/uL — ABNORMAL HIGH (ref 4.0–10.5)

## 2017-12-02 LAB — ECHOCARDIOGRAM COMPLETE
Height: 65 in
Weight: 3982.39 oz

## 2017-12-02 LAB — COMPREHENSIVE METABOLIC PANEL
ALBUMIN: 2.5 g/dL — AB (ref 3.5–5.0)
ALK PHOS: 82 U/L (ref 38–126)
ALT: 12 U/L — ABNORMAL LOW (ref 14–54)
ANION GAP: 12 (ref 5–15)
AST: 20 U/L (ref 15–41)
BILIRUBIN TOTAL: 0.6 mg/dL (ref 0.3–1.2)
BUN: 8 mg/dL (ref 6–20)
CALCIUM: 7.9 mg/dL — AB (ref 8.9–10.3)
CO2: 21 mmol/L — ABNORMAL LOW (ref 22–32)
Chloride: 97 mmol/L — ABNORMAL LOW (ref 101–111)
Creatinine, Ser: 0.47 mg/dL (ref 0.44–1.00)
Glucose, Bld: 103 mg/dL — ABNORMAL HIGH (ref 65–99)
POTASSIUM: 4.2 mmol/L (ref 3.5–5.1)
Sodium: 130 mmol/L — ABNORMAL LOW (ref 135–145)
TOTAL PROTEIN: 5.6 g/dL — AB (ref 6.5–8.1)

## 2017-12-02 LAB — GLUCOSE, CAPILLARY
GLUCOSE-CAPILLARY: 96 mg/dL (ref 65–99)
Glucose-Capillary: 97 mg/dL (ref 65–99)

## 2017-12-02 LAB — MAGNESIUM: MAGNESIUM: 1.2 mg/dL — AB (ref 1.7–2.4)

## 2017-12-02 LAB — PHOSPHORUS: Phosphorus: 3.5 mg/dL (ref 2.5–4.6)

## 2017-12-02 LAB — MRSA PCR SCREENING: MRSA BY PCR: POSITIVE — AB

## 2017-12-02 LAB — IRON AND TIBC
Iron: 131 ug/dL (ref 28–170)
Saturation Ratios: 66 % — ABNORMAL HIGH (ref 10.4–31.8)
TIBC: 199 ug/dL — ABNORMAL LOW (ref 250–450)
UIBC: 68 ug/dL

## 2017-12-02 LAB — OSMOLALITY: OSMOLALITY: 272 mosm/kg — AB (ref 275–295)

## 2017-12-02 MED ORDER — ORAL CARE MOUTH RINSE
15.0000 mL | Freq: Two times a day (BID) | OROMUCOSAL | Status: DC
  Administered 2017-12-02 – 2017-12-09 (×10): 15 mL via OROMUCOSAL

## 2017-12-02 MED ORDER — ENOXAPARIN SODIUM 60 MG/0.6ML ~~LOC~~ SOLN
55.0000 mg | SUBCUTANEOUS | Status: DC
  Administered 2017-12-02 – 2017-12-08 (×7): 55 mg via SUBCUTANEOUS
  Filled 2017-12-02 (×7): qty 0.6

## 2017-12-02 MED ORDER — MUPIROCIN 2 % EX OINT
1.0000 "application " | TOPICAL_OINTMENT | Freq: Two times a day (BID) | CUTANEOUS | Status: AC
  Administered 2017-12-02 – 2017-12-06 (×10): 1 via NASAL
  Filled 2017-12-02 (×5): qty 22

## 2017-12-02 MED ORDER — ADULT MULTIVITAMIN W/MINERALS CH
1.0000 | ORAL_TABLET | Freq: Every day | ORAL | Status: DC
  Administered 2017-12-02 – 2017-12-09 (×8): 1 via ORAL
  Filled 2017-12-02 (×8): qty 1

## 2017-12-02 MED ORDER — MAGNESIUM SULFATE 4 GM/100ML IV SOLN
4.0000 g | Freq: Once | INTRAVENOUS | Status: AC
  Administered 2017-12-02: 4 g via INTRAVENOUS
  Filled 2017-12-02: qty 100

## 2017-12-02 MED ORDER — CHLORHEXIDINE GLUCONATE CLOTH 2 % EX PADS
6.0000 | MEDICATED_PAD | Freq: Every day | CUTANEOUS | Status: AC
  Administered 2017-12-02 – 2017-12-06 (×4): 6 via TOPICAL

## 2017-12-02 NOTE — Progress Notes (Signed)
Initial Nutrition Assessment  DOCUMENTATION CODES:   Severe malnutrition in context of acute illness/injury  INTERVENTION:   MVI daily  Recommend Premier Protein shakes TID once diet advanced   NUTRITION DIAGNOSIS:   Severe Malnutrition related to acute illness(N/V/D with pancolitis, diverticulitis with microperforation) as evidenced by energy intake < or equal to 50% for > or equal to 5 days, percent weight loss, moderate muscle depletion.  GOAL:   Patient will meet greater than or equal to 90% of their needs  MONITOR:   PO intake, Supplement acceptance, Diet advancement, Labs, Weight trends  REASON FOR ASSESSMENT:   Consult, Malnutrition Screening Tool Assessment of nutrition requirement/status  ASSESSMENT:   52 yo female admitted with N/V/D/abdominal discomfort for 2 months ago (but first started to notice symptoms 9 months ago), hypovolemic hyponatremia. Pt with septic shock/severe sepsis with pan colitis, possible diverticulitis with microperforation, secretory diarrea, possible gastroparesis. Pt with hx of DM, COPD, HLD, HTN, GERD   Pt reports minimal intake over the past few months but po intake has been less since having colonoscopy surgery 9 months ago. Pt has been experiencing occassional N/V/D since the colonoscopy but has gotten much worse over the past 2 months.  Pt has been eating 1/2 cup yogurt daily for the past 2 months; drinking plenty of fluids but no other "solid food." Family reports that some days she would not even eat the yogurt  Reports UBW around 340-350 pounds and that pt last weighed this 2 months ago. Current wt 248 pounds; 27% wt loss in 2 months which is significant for time frame.  Per weight encounters; pt with weight of 284 pounds in February 2018. 16.5% wt loss in 2 months which is still significant for time frame.   Pt has been too weak to walk; pt has been using a wheel chair and family has been assisting with transfers to chair, potty, etc.  Prior to several months ago, pt walking without difficulty  Labs: sodium 129, magnesium 1.2, HGbA1c 5.4 Meds: NS at 100 ml/hr  NUTRITION - FOCUSED PHYSICAL EXAM:    Most Recent Value  Orbital Region  Moderate depletion  Upper Arm Region  No depletion  Thoracic and Lumbar Region  No depletion  Buccal Region  No depletion  Temple Region  Moderate depletion  Clavicle Bone Region  Moderate depletion  Clavicle and Acromion Bone Region  Moderate depletion  Scapular Bone Region  Moderate depletion  Dorsal Hand  No depletion  Patellar Region  Moderate depletion  Anterior Thigh Region  Unable to assess  Posterior Calf Region  Mild depletion  Edema (RD Assessment)  None       Diet Order:  Diet clear liquid Room service appropriate? Yes; Fluid consistency: Thin  EDUCATION NEEDS:   Education needs have been addressed  Skin:  Skin Assessment: Skin Integrity Issues: Skin Integrity Issues:: DTI DTI: coccyx  Last BM:  4/12  Height:   Ht Readings from Last 1 Encounters:  12/01/17 5\' 5"  (1.651 m)    Weight:   Wt Readings from Last 1 Encounters:  12/02/17 248 lb 14.4 oz (112.9 kg)    Ideal Body Weight:  56.8 kg  BMI:  Body mass index is 41.42 kg/m.  Estimated Nutritional Needs:   Kcal:  2000-2200 kcals  Protein:  113-125 g  Fluid:  >/= 2 L   Romelle Starcherate Orell Hurtado MS, RD, LDN, CNSC [phone] Pager  [phone]) [phone] Weekend/On-Call Pager

## 2017-12-02 NOTE — Progress Notes (Addendum)
PULMONARY / CRITICAL CARE MEDICINE   Name: Julie Bird MRN:   [id number] DOB:   08-Mar-1966         ADMISSION DATE:  12/01/2017 CONSULTATION DATE:  12/01/17  REFERRING MD:  Dr. Nelson Chimes [facility] Medicine teaching service)  CHIEF COMPLAINT:  Nausea, vomiting, and abdominal discomfort for 2 months  HISTORY OF PRESENT ILLNESS:   [name] is a 52 y.o f with diabetes, hypertension, alzheimer's disease, and copd on 4L at home who presented with nausea, vomiting, and diarrhea for the past 2 months. The patient stated that she first started noticing these symptoms 9 months ago after the patient she had a colonoscopy done with removal of 7 polyps. She feels that she has lost up to 120lbs over the past few months and she has had decreased appetite and has only been eating 1/2 cup of yogurt daily. Patient stated that she went to her gastroenterogist a few days ago and was just told to drink gatorade.   Patient's blood pressure has been hypotensive, she has been tachypnic, afebrile, no tachycardia. Labs were remarkable for sodium of 126, mild lipase elevation at 91, UA pos for mod leukocytes and negative for nitrites. GI panel negative.   Chest xray without any consolidation, infiltrate, or effusion. CT abdomen showed increase in bowel wall thickening affecting left, right, and transverse colon. There was a new microperforation 1.5cm in diameter that was seen. There was also right lower lobe pneumonia noted.   Subjective No further nausea or vomiting abdominal pain is controlled Objective  Blood Pressure 123/73   Pulse 95   Temperature 97.8 F (36.6 C) (Oral)   Respiration 18   Height 5\' 5"  (1.651 m)   Weight 248 lb 14.4 oz (112.9 kg)   Oxygen Saturation 100%   Body Mass Index 41.42 kg/m      Intake/Output Summary (Last 24 hours) at 12/02/2017 1116 Last data filed at 12/02/2017 1000 Gross per 24 hour  Intake 2118 ml  Output 450 ml  Net 1668 ml    Physical exam General: Chronically  ill-appearing 52 year old female currently resting comfortably in bed HEENT: Normocephalic atraumatic no jugular venous tension.  Thrush noted but improving Pulmonary: Clear to auscultation diminished bases Cardiac: Regular rate and rhythm Abdomen: Soft nontender no organomegaly Extremity: Brisk cap refill, no edema, warm, dry. Neuro: Awake oriented no focal deficits CBC Recent Labs    12/01/17 1132 12/02/17 0429  WBC 9.5 12.7*  HGB 10.1* 9.3*  HCT 31.7* 29.6*  PLT 120* 141*    Coag's Recent Labs    12/01/17 1132  INR 1.06    BMET Recent Labs    12/02/17 0029 12/02/17 0429 12/02/17 0745  NA 129* 130* 129*  K 3.9 4.2 3.6  CL 94* 97* 96*  CO2 22 21* 23  BUN 8 8 7   CREATININE 0.52 0.47 0.49  GLUCOSE 109* 103* 95    Electrolytes Recent Labs    12/02/17 0029 12/02/17 0429 12/02/17 0745  CALCIUM 8.2* 7.9* 7.5*  MG  --  1.2*  --   PHOS  --  3.5  --     Sepsis Markers Recent Labs    12/01/17 1932  PROCALCITON 0.51    ABG No results for input(s): PHART, PCO2ART, PO2ART in the last 72 hours.  Liver Enzymes Recent Labs    12/01/17 1132 12/02/17 0429  AST 23 20  ALT 12* 12*  ALKPHOS 100 82  BILITOT 0.7 0.6  ALBUMIN 2.9* 2.5*    Cardiac Enzymes  No results for input(s): TROPONINI, PROBNP in the last 72 hours.  Glucose No results for input(s): GLUCAP in the last 72 hours.  Imaging Ct Abdomen Pelvis W Contrast  Result Date: 12/01/2017 CLINICAL DATA:  Continued surveillance diverticulitis. Nausea, vomiting, and diarrhea for 4 months. EXAM: CT ABDOMEN AND PELVIS WITH CONTRAST TECHNIQUE: Multidetector CT imaging of the abdomen and pelvis was performed using the standard protocol following bolus administration of intravenous contrast. CONTRAST:  ISOVUE-300 IOPAMIDOL (ISOVUE-300) INJECTION 61% COMPARISON:  09/28/2017. FINDINGS: Lower chest: RIGHT lower lobe infiltrate. This is a new finding from February. This affects primarily the posterior basal  segment of the RIGHT lower lobe. No significant effusion. LEFT lung clear. Hepatobiliary: Steatosis. No focal liver abnormality. Cholecystectomy. Pancreas: Unremarkable. No pancreatic ductal dilatation or surrounding inflammatory changes. Low Spleen: Splenomegaly.  No focal lesions. Adrenals/Urinary Tract: Adrenal glands are unremarkable. Kidneys are normal, without renal calculi, focal lesion, or hydronephrosis. Bladder is unremarkable. Stomach/Bowel: Mild changes of descending/sigmoid colon colitis, with bowel wall thickening and reduction in luminal caliber. Mild mesenteric edema. Small 1.5 cm microperforation is seen at the proximal sigmoid region, series 3, image 80, more characteristic of diverticulitis. The ascending and transverse colon demonstrate bowel wall edema, new/increased from priors, raising the question of diffuse colitis. Infectious and inflammatory causes of colitis should be considered. No bowel obstruction. No free air or significant free fluid. Vascular/Lymphatic: Aortic atherosclerosis without enlarged abdominal or pelvic lymph nodes. Reproductive: Uterus and bilateral adnexa are unremarkable. Other: Small periumbilical paramedian hernia containing fat. Slight associated mesenteric edema. Musculoskeletal: No acute or significant osseous findings. IMPRESSION: Mild increase in diffuse bowel wall thickening affecting not only the LEFT, but RIGHT and transverse colonic regions raising the question of diffuse colitis. Infectious and inflammatory causes should be considered. In the proximal descending region, there is a new microperforation, 1.5 cm in diameter, more characteristic of diverticulitis. Diverticulosis with diverticulitis was demonstrated on the previous scan, but may be mildly increased. Splenomegaly.  Hepatic steatosis. RIGHT lower lobe pneumonia, new from February. Electronically Signed   By: Elsie Stain M.D.   On: 12/01/2017 15:12   Dg Chest Port 1 View  Result Date:  12/01/2017 CLINICAL DATA:  Shortness of breath and cough EXAM: PORTABLE CHEST 1 VIEW COMPARISON:  09/28/2017 FINDINGS: The heart size and mediastinal contours are within normal limits. Both lungs are clear. The visualized skeletal structures are unremarkable. IMPRESSION: No active disease. Electronically Signed   By: Alcide Clever M.D.   On: 12/01/2017 12:34   STUDIES:  Chest xray (4/11): no infiltrate or consolidation Abdominal CT (4/11): increase in bowel wall thickening affecting left, right, and transverse colon. There was a new microperforation 1.5cm in diameter that was seen.  CULTURES: Blood culture (4/11) pending Urine culture (4/11) pending  ANTIBIOTICS: Zosyn (12/01/17)>  SIGNIFICANT EVENTS: Admission to ICU  LINES/TUBES: Peripheral IV in left and right antecubital   Impression/plan  Severe sepsis/septic shock; c/b volume depletion in setting of pan-colitis and possible diverticulitis w/ microperf. -->shock resolved -seen bye surgical services-->no surgery recommended  Plan Day # 2 zosyn GI consulted.  Hold antihypertensives.  Cont IVFs Further recs per GI Clear liquids   Nausea and vomiting ? Gastroparesis  Plan Supportive care   Chronic hypoxic respiratory failure in setting of COPD Plan Cont oxygen 4 liters [place] Cont BDs Supportive care   Hypovolemic Hyponatremia  Plan Cont NaCl resuscitation  Serial chemistries (avoiding over correction)  Anemia of chronic disease  Plan  Trend cbc Transfuse as indicated  DM Plan ssi   Oral thrush Plan Nystatin   Critical care will sign off will ask family medicine to resume her care.  She can transfer to progressive care  Simonne MartinetPeter E Travarius Lange ACNP-BC [facility] Pulmonary/Critical Care Pager # [phone] OR # [phone] if no answer

## 2017-12-02 NOTE — Care Management Note (Addendum)
Case Management Note  Patient Details  Name: Julie Bird MRN: [id number] Date of Birth: 1965/12/24  Subjective/Objective: History of  HLD, COPD, hypothyroidism, hypertension, depression.  Admitted for Hypotension, Pancolitis   Action/Plan: Prior to admission patient lived at home with spouse.  PCP noted.  NCM will continue to monitor for discharge transition needs.  Expected Discharge Date:    To Be Determined              Expected Discharge Plan:    To Be Determined  Discharge planning Services  CM Consult  Status of Service:  In process, will continue to follow  Yancey FlemingsKimberly R Bryan Omura, RN  Nurse case manager [place] 12/02/2017, 10:21 AM

## 2017-12-02 NOTE — Progress Notes (Addendum)
Went by patient's room to introduce myself as part of the FM team. Patient says that her abdominal pain is better this AM. Appreciate the excellent care being provided by CCM/Pulmonology and Surgery. FPTS happy to resume care when patient stable for transfer.

## 2017-12-02 NOTE — Progress Notes (Signed)
Patient ID: Julie Bird, female   DOB: [birth year], 52 y.o.   MRN: [id number]       Subjective: Patient with no pain this morning.  Feels better than last night  Objective: Vital signs in last 24 hours: Temp:  [97.6 F (36.4 C)-99 F (37.2 C)] 99 F (37.2 C) (04/12 0746) Pulse Rate:  [85-107] 91 (04/12 0500) Resp:  [15-25] 21 (04/12 0500) BP: (65-131)/(33-83) 125/64 (04/12 0500) SpO2:  [91 %-100 %] 100 % (04/12 0500) Weight:  [105.7 kg (233 lb)-112.9 kg (248 lb 14.4 oz)] 112.9 kg (248 lb 14.4 oz) (04/12 0435) Last BM Date: 12/01/17  Intake/Output from previous day: 04/11 0701 - 04/12 0700 In: 1718 [I.V.:1718] Out: 450 [Urine:450] Intake/Output this shift: No intake/output data recorded.  PE: Heart: regular Lungs: some rhonchi noted at the bases Abd: morbidly obese, nontender to deep palpation. +BS, ND  Lab Results:  Recent Labs    12/01/17 1132 12/02/17 0429  WBC 9.5 12.7*  HGB 10.1* 9.3*  HCT 31.7* 29.6*  PLT 120* 141*   BMET Recent Labs    12/02/17 0029 12/02/17 0429  NA 129* 130*  K 3.9 4.2  CL 94* 97*  CO2 22 21*  GLUCOSE 109* 103*  BUN 8 8  CREATININE 0.52 0.47  CALCIUM 8.2* 7.9*   PT/INR Recent Labs    12/01/17 1132  LABPROT 13.7  INR 1.06   CMP     Component Value Date/Time   NA 130 (L) 12/02/2017 0429   K 4.2 12/02/2017 0429   CL 97 (L) 12/02/2017 0429   CO2 21 (L) 12/02/2017 0429   GLUCOSE 103 (H) 12/02/2017 0429   BUN 8 12/02/2017 0429   CREATININE 0.47 12/02/2017 0429   CALCIUM 7.9 (L) 12/02/2017 0429   PROT 5.6 (L) 12/02/2017 0429   ALBUMIN 2.5 (L) 12/02/2017 0429   AST 20 12/02/2017 0429   ALT 12 (L) 12/02/2017 0429   ALKPHOS 82 12/02/2017 0429   BILITOT 0.6 12/02/2017 0429   GFRNONAA >60 12/02/2017 0429   GFRAA >60 12/02/2017 0429   Lipase     Component Value Date/Time   LIPASE 91 (H) 12/01/2017 1132       Studies/Results: Ct Abdomen Pelvis W Contrast  Result Date: 12/01/2017 CLINICAL DATA:  Continued  surveillance diverticulitis. Nausea, vomiting, and diarrhea for 4 months. EXAM: CT ABDOMEN AND PELVIS WITH CONTRAST TECHNIQUE: Multidetector CT imaging of the abdomen and pelvis was performed using the standard protocol following bolus administration of intravenous contrast. CONTRAST:  ISOVUE-300 IOPAMIDOL (ISOVUE-300) INJECTION 61% COMPARISON:  09/28/2017. FINDINGS: Lower chest: RIGHT lower lobe infiltrate. This is a new finding from February. This affects primarily the posterior basal segment of the RIGHT lower lobe. No significant effusion. LEFT lung clear. Hepatobiliary: Steatosis. No focal liver abnormality. Cholecystectomy. Pancreas: Unremarkable. No pancreatic ductal dilatation or surrounding inflammatory changes. Low Spleen: Splenomegaly.  No focal lesions. Adrenals/Urinary Tract: Adrenal glands are unremarkable. Kidneys are normal, without renal calculi, focal lesion, or hydronephrosis. Bladder is unremarkable. Stomach/Bowel: Mild changes of descending/sigmoid colon colitis, with bowel wall thickening and reduction in luminal caliber. Mild mesenteric edema. Small 1.5 cm microperforation is seen at the proximal sigmoid region, series 3, image 80, more characteristic of diverticulitis. The ascending and transverse colon demonstrate bowel wall edema, new/increased from priors, raising the question of diffuse colitis. Infectious and inflammatory causes of colitis should be considered. No bowel obstruction. No free air or significant free fluid. Vascular/Lymphatic: Aortic atherosclerosis without enlarged abdominal or pelvic lymph nodes. Reproductive:  Uterus and bilateral adnexa are unremarkable. Other: Small periumbilical paramedian hernia containing fat. Slight associated mesenteric edema. Musculoskeletal: No acute or significant osseous findings. IMPRESSION: Mild increase in diffuse bowel wall thickening affecting not only the LEFT, but RIGHT and transverse colonic regions raising the question of diffuse  colitis. Infectious and inflammatory causes should be considered. In the proximal descending region, there is a new microperforation, 1.5 cm in diameter, more characteristic of diverticulitis. Diverticulosis with diverticulitis was demonstrated on the previous scan, but may be mildly increased. Splenomegaly.  Hepatic steatosis. RIGHT lower lobe pneumonia, new from February. Electronically Signed   By: Elsie StainJohn T Curnes M.D.   On: 12/01/2017 15:12   Dg Chest Port 1 View  Result Date: 12/01/2017 CLINICAL DATA:  Shortness of breath and cough EXAM: PORTABLE CHEST 1 VIEW COMPARISON:  09/28/2017 FINDINGS: The heart size and mediastinal contours are within normal limits. Both lungs are clear. The visualized skeletal structures are unremarkable. IMPRESSION: No active disease. Electronically Signed   By: Alcide CleverMark  Lukens M.D.   On: 12/01/2017 12:34    Anti-infectives: Anti-infectives (From admission, onward)   Start     Dose/Rate Route Frequency Ordered Stop   12/02/17 0400  piperacillin-tazobactam (ZOSYN) IVPB 3.375 g     3.375 g 12.5 mL/hr over 240 Minutes Intravenous Every 8 hours 12/01/17 2236     12/01/17 2000  piperacillin-tazobactam (ZOSYN) IVPB 3.375 g  Status:  Discontinued     3.375 g 12.5 mL/hr over 240 Minutes Intravenous Every 8 hours 12/01/17 1232 12/01/17 2208   12/01/17 1930  azithromycin (ZITHROMAX) 500 mg in sodium chloride 0.9 % 250 mL IVPB  Status:  Discontinued     500 mg 250 mL/hr over 60 Minutes Intravenous Every 24 hours 12/01/17 1919 12/01/17 2208   12/01/17 1230  piperacillin-tazobactam (ZOSYN) IVPB 3.375 g     3.375 g 100 mL/hr over 30 Minutes Intravenous  Once 12/01/17 1216 12/01/17 1324       Assessment/Plan  Pancolitis  -On zosyn.  WBC slightly up today to 12.7, but pain improved and feeling better. -can consider clear liquids possibly given she is overall improving. -would benefit from GI evaluation, but [place]  significant findings, except a couple polyps that were removed. -no need for acute surgical intervention at this time  FEN - IVFs/NPO, but might be able to advance to clears VTE - SCDs/Lovenox ID - Zosyn  HTN - meds on hold secondary to hypotension on admission, which has improved with resuscitation. COPD/asthma - stopped smoking 12 years  Dispo - will follow, cont medical management  LOS: 1 day    Letha CapeKelly E Longino Trefz , [facility] 12/02/2017, 8:34 AM Pager: [phone]

## 2017-12-02 NOTE — Progress Notes (Signed)
Pt is assisted for all mobility and ADL with exception of feeding and grooming. She is profoundly weak and was told by her MD that therapy would not benefit her until her nutrition improved. Pt will benefit from intensive rehab. Will follow acutely.  12/02/17 1031  OT Visit Information  Last OT Received On 12/02/17  Assistance Needed +2  PT/OT/SLP Co-Evaluation/Treatment Yes  Reason for Co-Treatment For patient/therapist safety  OT goals addressed during session ADL's and self-care  History of Present Illness Pt is a 52 year old woman admitted 12/01/17 with FTT with chronic N/V/D and hypotension. Pt with colitis vs diverticulitis vs gastroparesis. PMH: anxiety, depression, COPD on 4L 02 at home, HTN, diverticulosis.  Precautions  Precautions Fall  Restrictions  Weight Bearing Restrictions No  Home Living  Family/patient expects to be discharged to: Private residence  Living Arrangements Spouse/significant other;Other relatives;Other (Comment) (two grandchildren)  Available Help at Discharge Family;Available 24 hours/day  Type of Home House  Home Access Ramped entrance  Home Layout One level  Bathroom Shower/Tub Tub/shower unit  Geophysicist/field seismologistBathroom Toilet Standard  Home Equipment Wheelchair - Economistmanual;Shower seat;Walker - 2 wheels  Additional [facility]  Prior Function  Level of [place] Needs assistance  Gait / Transfers Assistance Needed uses a w/c for mobility, requires assistance with transfers, requires assistance to propel  ADL's / Homemaking Assistance Needed requires assistance with bathing and dressing, can feed herself  Comments sleeps on the couch  Communication  Communication No difficulties  Pain Assessment  Pain Assessment Faces  Faces Pain Scale 6  Pain Location L shoulder with PROM  Pain Descriptors / Indicators Grimacing;Guarding;Sore  Pain Intervention(s) Monitored during session;Repositioned  Cognition  Arousal/Alertness Awake/alert   Behavior During Therapy WFL for tasks assessed/performed  Overall Cognitive Status Within Functional Limits for tasks assessed  Upper Extremity Assessment  Upper Extremity Assessment RUE deficits/detail;LUE deficits/detail  RUE Deficits / Details 2/5 shoulder, 3+/5 elbow, 4/5 gross grasp  RUE Coordination decreased gross motor  LUE Deficits / Details 2+/5 shoulder, 3+/5 elbow, 4/5 gross grasp  LUE Coordination decreased gross motor  Lower Extremity Assessment  Lower Extremity Assessment Defer to PT evaluation  ADL  Overall ADL's  Needs assistance/impaired  Eating/Feeding Set up;Sitting  Grooming Wash/dry hands;Wash/dry face;Set up;Sitting  Grooming Details (indicate cue type and reason) edentulous  Upper Body Bathing Maximal assistance;Sitting  Lower Body Bathing Total assistance;+2 for physical assistance;Sit to/from stand  Upper Body Dressing  Moderate assistance;Sitting  Lower Body Dressing Total assistance;+2 for physical assistance;Sit to/from stand  Toileting- ArchitectClothing Manipulation and Hygiene Total assistance;Bed level  Functional mobility during ADLs  (unable to pivot today)  Vision- History  Baseline Vision/History No visual deficits  Patient Visual Report No change from baseline  Bed Mobility  Overal bed mobility Needs Assistance  Bed Mobility Rolling;Supine to Sit;Sit to Supine  Rolling Max assist  Supine to sit +2 for physical assistance;Total assist  Sit to supine +2 for physical assistance;Total assist  General bed mobility comments pt unable to self assist using rails due to UE weakness, assist for all aspects and to position hip at EOB with bed pad  Transfers  Overall transfer level Needs assistance  Equipment used Rolling walker (2 wheeled)  Transfers Sit to/from Stand  Sit to Stand +2 physical assistance;Mod assist  General transfer comment assist to rise with gait belt and use of bed pad under hips, steadying assist with pt tolerating x 10 seconds before  needing to return to sitting,  unable to attempt side steps toward [facility].  Balance  Overall balance assessment Needs assistance  Sitting balance-Leahy Scale Fair  Standing balance-Leahy Scale Poor  OT - End of Session  Equipment Utilized During Treatment Gait belt;Rolling walker;Oxygen (4L)  Activity Tolerance Patient tolerated treatment well  Patient left in bed;with call bell/phone within reach;with bed alarm set;with family/visitor present  OT Assessment  OT Recommendation/Assessment Patient needs continued OT Services  OT Visit Diagnosis Unsteadiness on feet (R26.81);Pain;Muscle weakness (generalized) (M62.81)  OT Problem List Decreased strength;Decreased activity tolerance;Impaired balance (sitting and/or standing);Decreased coordination;Decreased knowledge of use of DME or AE;Cardiopulmonary status limiting activity;Pain;Impaired UE functional use;Obesity  OT Plan  OT Frequency (ACUTE ONLY) Min 2X/week  OT Treatment/Interventions (ACUTE ONLY) Self-care/ADL training;Therapeutic exercise;DME and/or AE instruction;Patient/family education;Balance training;Therapeutic activities  AM-PAC OT "6 Clicks" Daily Activity Outcome Measure  Help from another person eating meals? 3  Help from another person taking care of personal grooming? 3  Help from another person toileting, which includes using toliet, bedpan, or urinal? 1  Help from another person bathing (including washing, rinsing, drying)? 2  Help from another person to put on and taking off regular upper body clothing? 2  Help from another person to put on and taking off regular lower body clothing? 1  6 Click Score 12  ADL G Code Conversion CL  OT Recommendation  Follow Up Recommendations SNF  OT Equipment 3 in 1 bedside commode (bariatric)  Individuals Consulted  Consulted and Agree with Results and Recommendations Patient  Acute Rehab OT Goals  Patient Stated Goal to get stronger  OT Goal Formulation With patient  Time For Goal  Achievement 12/16/17  Potential to Achieve Goals Good  OT Time Calculation  OT Start Time (ACUTE ONLY) 0948  OT Stop Time (ACUTE ONLY) 1029  OT Time Calculation (min) 41 min  OT General Charges  $OT Visit 1 Visit  OT Evaluation  $OT Eval Moderate Complexity 1 Mod  Written Expression  Dominant Hand Right  12/02/2017 Martie Round, OTR/L Pager: [phone]

## 2017-12-02 NOTE — Evaluation (Signed)
Physical Therapy Evaluation Patient Details Name:  Julie Bird MRN: [id number] DOB: July 23, 1966 Today's Date: 12/02/2017   History of Present Illness  Pt is a 52 year old woman admitted 12/01/17 with FTT with chronic N/V/D and hypotension. Pt with colitis vs diverticulitis vs gastroparesis. PMH: anxiety, depression, COPD on 4L 02 at home, HTN, diverticulosis.    Clinical Impression  Pt presented supine in bed with HOB elevated, awake and willing to participate in therapy session. Prior to admission, pt reported that she uses a w/c primarily for mobility and requires some assistance with transfers. Pt also stated that she requires assistance with ADLs. Pt is very limited secondary to weakness at this time. She requires total A x2 for bed mobility and mod A x2 for transfers with RW. Pt very interested in getting stronger and to gain more independence with taking care of herself. All VSS throughout. Pt would benefit from further intensive therapy services in CIR to maximize her independence with functional mobility at a w/c level prior to returning home with family. PT will continue to follow acutely to progress mobility as tolerated.    Follow Up Recommendations CIR;Supervision/Assistance - 24 hour    Equipment Recommendations  None recommended by PT    Recommendations for Other Services       Precautions / Restrictions Precautions Precautions: Fall Restrictions Weight Bearing Restrictions: No      Mobility  Bed Mobility Overal bed mobility: Needs Assistance Bed Mobility: Rolling;Supine to Sit;Sit to Supine Rolling: Max assist   Supine to sit: +2 for physical assistance;Total assist Sit to supine: +2 for physical assistance;Total assist   General bed mobility comments: pt unable to self assist using rails due to UE weakness, assist for all aspects and to position hip at EOB with bed pad  Transfers Overall transfer level: Needs assistance Equipment used: Rolling walker (2  wheeled) Transfers: Sit to/from Stand Sit to Stand: +2 physical assistance;Mod assist         General transfer comment: assist to rise with gait belt and use of bed pad under hips, steadying assist with pt tolerating x 10 seconds before needing to return to sitting, unable to attempt side steps toward HOB.  Ambulation/Gait                Stairs            Wheelchair Mobility    Modified Rankin (Stroke Patients Only)       Balance Overall balance assessment: Needs assistance Sitting-balance support: Feet supported Sitting balance-Leahy Scale: Fair Sitting balance - Comments: pt tolerated sitting EOB ~15 mins with min guard for safety   Standing balance support: Bilateral upper extremity supported Standing balance-Leahy Scale: Poor                               Pertinent Vitals/Pain Pain Assessment: Faces Faces Pain Scale: Hurts even more Pain Location: L shoulder with PROM Pain Descriptors / Indicators: Grimacing;Guarding;Sore Pain Intervention(s): Monitored during session;Repositioned    Home Living Family/patient expects to be discharged to:: Private residence Living Arrangements: Spouse/significant other;Other relatives;Other (Comment)(two grandchildren) Available Help at Discharge: Family;Available 24 hours/day Type of Home: House Home Access: Ramped entrance     Home Layout: One level Home Equipment: Wheelchair - Economist - 2 wheels Additional Comments: supposed to be getting at [facility] per pt    Prior Function Level of Independence: Needs assistance   Gait / Transfers Assistance Needed: uses a  w/c for mobility, requires assistance with transfers, requires assistance to propel  ADL's / Homemaking Assistance Needed: requires assistance with bathing and dressing, can feed herself  Comments: sleeps on the couch     Hand Dominance   Dominant Hand: Right    Extremity/Trunk Assessment   Upper Extremity  Assessment Upper Extremity Assessment: Defer to OT evaluation RUE Deficits / Details: 2/5 shoulder, 3+/5 elbow, 4/5 gross grasp RUE Coordination: decreased gross motor LUE Deficits / Details: 2+/5 shoulder, 3+/5 elbow, 4/5 gross grasp LUE Coordination: decreased gross motor    Lower Extremity Assessment Lower Extremity Assessment: Generalized weakness       Communication   Communication: No difficulties  Cognition Arousal/Alertness: Awake/alert Behavior During Therapy: WFL for tasks assessed/performed Overall Cognitive Status: Within Functional Limits for tasks assessed                                        General Comments      Exercises     Assessment/Plan    PT Assessment Patient needs continued PT services  PT Problem List Decreased strength;Decreased activity tolerance;Decreased balance;Decreased mobility;Decreased coordination;Decreased knowledge of use of DME;Decreased safety awareness;Decreased knowledge of precautions;Cardiopulmonary status limiting activity;Pain       PT Treatment Interventions DME instruction;Gait training;Stair training;Functional mobility training;Therapeutic activities;Therapeutic exercise;Balance training;Neuromuscular re-education;Patient/family education    PT Goals (Current goals can be found in the Care Plan section)  Acute Rehab PT Goals Patient Stated Goal: to get stronger PT Goal Formulation: With patient Time For Goal Achievement: 12/16/17 Potential to Achieve Goals: Good    Frequency Min 3X/week   Barriers to discharge        Co-evaluation PT/OT/SLP Co-Evaluation/Treatment: Yes Reason for Co-Treatment: Complexity of the patient's impairments (multi-system involvement);For patient/therapist safety;To address functional/ADL transfers PT goals addressed during session: Mobility/safety with mobility;Balance;Proper use of DME;Strengthening/ROM OT goals addressed during session: ADL's and self-care        AM-PAC PT "6 Clicks" Daily Activity  Outcome Measure Difficulty turning over in bed (including adjusting bedclothes, sheets and blankets)?: Unable Difficulty moving from lying on back to sitting on the side of the bed? : Unable Difficulty sitting down on and standing up from a chair with arms (e.g., wheelchair, bedside commode, etc,.)?: Unable Help needed moving to and from a bed to chair (including a wheelchair)?: A Lot Help needed walking in hospital room?: Total Help needed climbing 3-5 steps with a railing? : Total 6 Click Score: 7    End of Session Equipment Utilized During Treatment: Gait belt;Oxygen Activity Tolerance: Patient limited by fatigue Patient left: in bed;with call bell/phone within reach;with bed alarm set;with family/visitor present Nurse Communication: Mobility status PT Visit Diagnosis: Other abnormalities of gait and mobility (R26.89);Muscle weakness (generalized) (M62.81)    Time: [id number] PT Time Calculation (min) (ACUTE ONLY): 33 min   Charges:   PT Evaluation $PT Eval Moderate Complexity: 1 Mod     PT G Codes:        QuilceneJennifer Avenly Roberge, PT, DPT [phone]   Alessandra BevelsJennifer M Jayren Cease 12/02/2017, 11:22 AM

## 2017-12-02 NOTE — Progress Notes (Signed)
Attempted report to 6N, awaiting return call from RN

## 2017-12-02 NOTE — Progress Notes (Signed)
Pt transferred to unit at approximately 1530, her buttocks and groin/folds were red - ointment was applied to area. Reviewed Pt's assessment and agreed with assessment. Pt was transferred from unit bed to 6N unit floor bed. Pt's daughter was at her side.  Pt denied any pain at the time of transfer.

## 2017-12-02 NOTE — Consult Note (Addendum)
Referring Provider: [facility]  Primary Care Physician:  Truddie Coco, FNP Primary Gastroenterologist:   unassigned Reason for Consultation:  colitis on CT scan / diarrhea   ASSESSMENT AND PLAN:     59. 52 year-old female with recent documented diverticulitis admitted with hypotensive shock / diffuse colitis and microperforation of left colon (diverticular perforation?) on CT scan. She has been having intermittent diarrhea for two months. Recent antibiotic for diverticulitis but C-diff negative. Stool path panel negative.  Apparently had a colonoscopy a few months back in [place].  -Perforation possibly diverticular. Additionally suspect infectious colitis though usually self-limiting and her diarrhea has been ongoing for two month. Await O+P.  -Continue IV antibiotics, supportive care.  -She may need repeat colonoscopy at some point if fails to improve. Of course we wouldn't want to pursue in setting of recent microperforation.    2. Splenomegaly / steatosis on CT scan. She is thrombocytopenic, even back in Feb. Coags normal. Albumin low but could be nutritional or reactant as it was normal in Feb.Non cirrhotic liver on CT scan.      Attending physician's note   I have taken an interval history, reviewed the chart and examined the patient. I agree with the Advanced Practitioner's note, impression and recommendations. CT reviewed. Pt with acute diverticulitis without abscess. Conservative management for now. May have early liver cirrhosis which can be evaluated as outpatient.   Edman Circle, MD  HPI: Julie Bird is a 52 y.o. female with COPD, DM, anemia of chronic disease and depression who presented to ED yesterday with nausea, vomiting and diarrhea. She was hypotensive requiring pressors,  admitted with sepsis. Blood, urine cultures pending. C-diff and pathogen panel pending, ova and parasite pending . CTA/P revealed diffuse colitis, diverticulosis, microperforation in prox descending  colon, steatosis, splenomegaly and RLL PNA. Surgery has evaluated. WBC 12.7, medical management recommended.   Patient says she had a colonoscopy with polypectomy a few months ago in [place].  Following that she was treated for diverticulitis.  She relapsed, treated with a second course of antibiotics for diverticulitis.  Since then she has had frequent diarrhea.  Some nausea and vomiting but not predominant symptom.  She had no significant abdominal pain until a few days ago.  She points to the right mid abdomen as area where abdominal pain occurred.  The pain is intermittent, not related to meals.  She is no longer having any abdominal pain.  No fevers at home.  No blood in her stool.  She has not started any new medications recently except the antibiotics for diverticulitis. She does take Glucophage.   Patient says she lost a significant amount of weight recently.  Her appetite is diminished. She has a 2nd cousin with Crohn's, no other known GI diseases in family      Past Medical History:  Diagnosis Date  . Asthma   . COPD (chronic obstructive pulmonary disease) (HCC)   . Diverticulitis   . Hypertension     Past Surgical History:  Procedure Laterality Date  . CHOLECYSTECTOMY    . CYSTOSTOMY W/ BLADDER DILATION      Prior to Admission medications   Medication Sig Start Date End Date Taking? Authorizing Provider  albuterol (PROVENTIL) (2.5 MG/3ML) 0.083% nebulizer solution Take 2.5 mg by nebulization 2 (two) times daily as needed for wheezing or shortness of breath.   Yes [provider]  benzonatate (TESSALON) 100 MG capsule Take 100 mg by mouth 3 (three) times daily as needed for cough.  08/22/17  Yes [provider]  Cholecalciferol (VITAMIN D3) 50000 units CAPS Take 1 capsule by mouth every Monday, Wednesday, and Friday.  09/16/17  Yes [provider]  CLARITIN 10 MG tablet Take 10 mg by mouth at bedtime. 11/30/17  Yes [provider]  clonazePAM  (KLONOPIN) 2 MG tablet Take 2 mg by mouth 4 (four) times daily as needed for anxiety.  08/30/17  Yes [provider]  DALIRESP 500 MCG TABS tablet Take 500 mg by mouth daily. 08/27/17  Yes [provider]  ferrous sulfate 324 (65 Fe) MG TBEC TAKE 1 TABLET BY MOUTH TWICE A DAY 06/22/17  Yes [provider]  fluticasone (FLONASE) 50 MCG/ACT nasal spray Place 2 sprays into both nostrils daily.   Yes [provider]  folic acid (FOLVITE) 1 MG tablet Take 2 mg by mouth every morning. 09/10/17  Yes [provider]  furosemide (LASIX) 20 MG tablet Take 20 mg by mouth every morning. 09/05/17  Yes [provider]  levothyroxine (SYNTHROID, LEVOTHROID) 75 MCG tablet Take 75 mcg by mouth daily. 08/18/17  Yes [provider]  Liniments (MUSCLE RUB MAXIMUM STRENGTH EX) Apply to affected areas up to two times a day- left shoulder and both knees   Yes [provider]  metFORMIN (GLUCOPHAGE) 500 MG tablet Take 500 mg by mouth at bedtime as needed (for high BGL).  09/21/17  Yes [provider]  montelukast (SINGULAIR) 10 MG tablet Take 10 mg by mouth at bedtime.  08/18/17  Yes [provider]  nystatin cream (MYCOSTATIN) Apply 1 application topically daily. UNDER BOTH BREASTS 08/24/17  Yes [provider]  potassium chloride (KLOR-CON) 8 MEQ tablet Take 24 mEq by mouth every morning. 09/16/17  Yes [provider]  pravastatin (PRAVACHOL) 80 MG tablet Take 80 mg by mouth at bedtime. 08/18/17  Yes [provider]  predniSONE (DELTASONE) 5 MG tablet Take 5 mg by mouth every morning. 09/20/17  Yes [provider]  PROAIR HFA 108 (90 Base) MCG/ACT inhaler Inhale 1-2 puffs into the lungs every 4 (four) hours as needed for wheezing or shortness of breath.  09/16/17  Yes [provider]  promethazine (PHENERGAN) 25 MG tablet Take 25 mg by mouth every 8 (eight) hours as needed for nausea or vomiting.   09/09/17  Yes [provider]  ranitidine (ZANTAC) 300 MG tablet Take 300 mg by mouth 2 (two) times daily. 08/30/17  Yes [provider]  risperidone (RISPERDAL) 4 MG tablet Take 4 mg by mouth at bedtime. 08/29/17  Yes [provider]  sertraline (ZOLOFT) 100 MG tablet Take 100 mg by mouth 2 (two) times daily.  09/21/17  Yes [provider]  Monte Fantasia INHUB 500-50 MCG/DOSE AEPB Inhale 1 puff into the lungs 2 (two) times daily. 11/14/17  Yes [provider]  ciprofloxacin (CIPRO) 500 MG tablet Take 1 tablet (500 mg total) by mouth 2 (two) times daily. Patient not taking: Reported on 12/01/2017 09/28/17   Samuel Jester, DO  metroNIDAZOLE (FLAGYL) 500 MG tablet Take 1 tablet (500 mg total) by mouth 3 (three) times daily. Patient not taking: Reported on 12/01/2017 09/28/17   Samuel Jester, DO    Current Facility-Administered Medications  Medication Dose Route Frequency Provider Last Rate Last Dose  . 0.9 %  sodium chloride infusion  250 mL Intravenous PRN Valentino Nose, MD      . 0.9 %  sodium chloride infusion   Intravenous Continuous Lorenso Courier, MD 100  mL/hr at 12/02/17 0600    . albuterol (PROVENTIL) (2.5 MG/3ML) 0.083% nebulizer solution 2.5 mg  2.5 mg Nebulization BID PRN Freddrick March, MD      . Chlorhexidine Gluconate Cloth 2 % PADS 6 each  6 each Topical Q0600 Scatliffe, Gypsy Balsam, MD   6 each at 12/02/17 0435  . enoxaparin (LOVENOX) injection 40 mg  40 mg Subcutaneous Q24H Valentino Nose, MD   40 mg at 12/01/17 2305  . levothyroxine (SYNTHROID, LEVOTHROID) tablet 75 mcg  75 mcg Oral QAC breakfast Freddrick March, MD      . MEDLINE mouth rinse  15 mL Mouth Rinse BID Scatliffe, Kristen D, MD      . mometasone-formoterol (DULERA) 200-5 MCG/ACT inhaler 2 puff  2 puff Inhalation BID Freddrick March, MD      . montelukast (SINGULAIR) tablet 10 mg  10 mg Oral Suella Grove, MD   10 mg at 12/01/17 2305  . mupirocin ointment (BACTROBAN) 2 % 1 application   1 application Nasal BID Scatliffe, Gypsy Balsam, MD   1 application at 12/02/17 0347  . nystatin (MYCOSTATIN) 100000 UNIT/ML suspension 500,000 Units  5 mL Oral QID Valentino Nose, MD   500,000 Units at 12/01/17 2229  . ondansetron (ZOFRAN) tablet 4 mg  4 mg Oral Q6H PRN Freddrick March, MD       Or  . ondansetron (ZOFRAN) injection 4 mg  4 mg Intravenous Q6H PRN Freddrick March, MD      . pantoprazole (PROTONIX) EC tablet 40 mg  40 mg Oral Daily Valentino Nose, MD      . phenylephrine (NEO-SYNEPHRINE) 10 mg in sodium chloride 0.9 % 250 mL (0.04 mg/mL) infusion  0-400 mcg/min Intravenous Titrated Freddrick March, MD   Stopped at 12/02/17 0537  . piperacillin-tazobactam (ZOSYN) IVPB 3.375 g  3.375 g Intravenous Q8H Violeta Gelinas, MD 12.5 mL/hr at 12/02/17 0434 3.375 g at 12/02/17 0434  . pravastatin (PRAVACHOL) tablet 80 mg  80 mg Oral Suella Grove, MD   80 mg at 12/01/17 2305    Allergies as of 12/01/2017 - Review Complete 12/01/2017  Allergen Reaction Noted  . Codeine Shortness Of Breath 09/28/2017  . Lortab [hydrocodone-acetaminophen] Shortness Of Breath 09/28/2017  . Morphine and related Shortness Of Breath 09/28/2017    Family History  Problem Relation Age of Onset  . Diabetes Mother   . Hypertension Mother   . Alzheimer's disease Mother   . COPD Mother   . Emphysema Father   . Alzheimer's disease Father     Social History   Socioeconomic History  . Marital status: Married    Spouse name: Not on file  . Number of children: Not on file  . Years of education: Not on file  . Highest education level: Not on file  Occupational History  . Not on file  Social Needs  . Financial resource strain: Not on file  . Food insecurity:    Worry: Not on file    Inability: Not on file  . Transportation needs:    Medical: Not on file    Non-medical: Not on file  Tobacco Use  . Smoking status: Former Games developer  . Smokeless tobacco: Never Used  Substance and Sexual Activity  . Alcohol  use: No    Frequency: Never  . Drug use: No  . Sexual activity: Never    Birth control/protection: None  Lifestyle  . Physical activity:    Days per week: Not on file    Minutes  per session: Not on file  . Stress: Not on file  Relationships  . Social connections:    Talks on phone: Not on file    Gets together: Not on file    Attends religious service: Not on file    Active member of club or organization: Not on file    Attends meetings of clubs or organizations: Not on file    Relationship status: Not on file  . Intimate partner violence:    Fear of current or ex partner: Not on file    Emotionally abused: Not on file    Physically abused: Not on file    Forced sexual activity: Not on file  Other Topics Concern  . Not on file  Social History Narrative  . Not on file    Review of Systems: All systems reviewed and negative except where noted in HPI.  Physical Exam: Vital signs in last 24 hours: Temp:  [97.6 F (36.4 C)-99 F (37.2 C)] 99 F (37.2 C) (04/12 0746) Pulse Rate:  [85-107] 91 (04/12 0500) Resp:  [15-25] 21 (04/12 0500) BP: (65-131)/(33-83) 125/64 (04/12 0500) SpO2:  [91 %-100 %] 100 % (04/12 0500) Weight:  [233 lb (105.7 kg)-248 lb 14.4 oz (112.9 kg)] 248 lb 14.4 oz (112.9 kg) (04/12 0435) Last BM Date: 12/01/17 General:   Alert, obese white female in NAD Psych:  Pleasant, cooperative. Normal mood and affect. Eyes:  Pupils equal, sclera clear, no icterus.   Conjunctiva pink. Ears:  Normal auditory acuity. Nose:  No deformity, discharge,  or lesions. Neck:  Supple; no masses Lungs:  Normal respiratory effort.    Heart:  Regular rate and rhythm; no murmurs, no edema Abdomen:  Soft, obese,  nontender, BS active, no palp mass    Rectal:  Deferred  Msk:  Symmetrical without gross deformities. . Neurologic:  Alert and  oriented x4;  grossly normal neurologically. Skin:  Intact without significant lesions or rashes..   Intake/Output from previous  day: 04/11 0701 - 04/12 0700 In: 1718 [I.V.:1718] Out: 450 [Urine:450] Intake/Output this shift: No intake/output data recorded.  Lab Results: Recent Labs    12/01/17 1132 12/02/17 0429  WBC 9.5 12.7*  HGB 10.1* 9.3*  HCT 31.7* 29.6*  PLT 120* 141*   BMET Recent Labs    12/02/17 0029 12/02/17 0429 12/02/17 0745  NA 129* 130* 129*  K 3.9 4.2 3.6  CL 94* 97* 96*  CO2 22 21* 23  GLUCOSE 109* 103* 95  BUN 8 8 7   CREATININE 0.52 0.47 0.49  CALCIUM 8.2* 7.9* 7.5*   LFT Recent Labs    12/02/17 0429  PROT 5.6*  ALBUMIN 2.5*  AST 20  ALT 12*  ALKPHOS 82  BILITOT 0.6   PT/INR Recent Labs    12/01/17 1132  LABPROT 13.7  INR 1.06      Studies/Results: Ct Abdomen Pelvis W Contrast  Result Date: 12/01/2017 CLINICAL DATA:  Continued surveillance diverticulitis. Nausea, vomiting, and diarrhea for 4 months. EXAM: CT ABDOMEN AND PELVIS WITH CONTRAST TECHNIQUE: Multidetector CT imaging of the abdomen and pelvis was performed using the standard protocol following bolus administration of intravenous contrast. CONTRAST:  ISOVUE-300 IOPAMIDOL (ISOVUE-300) INJECTION 61% COMPARISON:  09/28/2017. FINDINGS: Lower chest: RIGHT lower lobe infiltrate. This is a new finding from February. This affects primarily the posterior basal segment of the RIGHT lower lobe. No significant effusion. LEFT lung clear. Hepatobiliary: Steatosis. No focal liver abnormality. Cholecystectomy. Pancreas: Unremarkable. No pancreatic ductal dilatation or surrounding inflammatory  changes. Low Spleen: Splenomegaly.  No focal lesions. Adrenals/Urinary Tract: Adrenal glands are unremarkable. Kidneys are normal, without renal calculi, focal lesion, or hydronephrosis. Bladder is unremarkable. Stomach/Bowel: Mild changes of descending/sigmoid colon colitis, with bowel wall thickening and reduction in luminal caliber. Mild mesenteric edema. Small 1.5 cm microperforation is seen at the proximal sigmoid region, series  3, image 80, more characteristic of diverticulitis. The ascending and transverse colon demonstrate bowel wall edema, new/increased from priors, raising the question of diffuse colitis. Infectious and inflammatory causes of colitis should be considered. No bowel obstruction. No free air or significant free fluid. Vascular/Lymphatic: Aortic atherosclerosis without enlarged abdominal or pelvic lymph nodes. Reproductive: Uterus and bilateral adnexa are unremarkable. Other: Small periumbilical paramedian hernia containing fat. Slight associated mesenteric edema. Musculoskeletal: No acute or significant osseous findings. IMPRESSION: Mild increase in diffuse bowel wall thickening affecting not only the LEFT, but RIGHT and transverse colonic regions raising the question of diffuse colitis. Infectious and inflammatory causes should be considered. In the proximal descending region, there is a new microperforation, 1.5 cm in diameter, more characteristic of diverticulitis. Diverticulosis with diverticulitis was demonstrated on the previous scan, but may be mildly increased. Splenomegaly.  Hepatic steatosis. RIGHT lower lobe pneumonia, new from February. Electronically Signed   By: Elsie Stain M.D.   On: 12/01/2017 15:12   Dg Chest Port 1 View  Result Date: 12/01/2017 CLINICAL DATA:  Shortness of breath and cough EXAM: PORTABLE CHEST 1 VIEW COMPARISON:  09/28/2017 FINDINGS: The heart size and mediastinal contours are within normal limits. Both lungs are clear. The visualized skeletal structures are unremarkable. IMPRESSION: No active disease. Electronically Signed   By: Alcide Clever M.D.   On: 12/01/2017 12:34     Willette Cluster, NP-C @  12/02/2017, 9:28 AM  Pager number [phone]

## 2017-12-02 NOTE — Progress Notes (Signed)
Inpatient Rehabilitation  Per PT recommendation, patient was screened by Fae PippinMelissa Hayleen Clinkscales for appropriateness for an Inpatient Acute Rehab consult.  Note that OT is recommending SNF and given current level of function, question if patient would be able to tolerate IP Rehab.  I can follow along in hopes of increased tolerance or if the team wants patient to be assessed by a rehab MD then please order a consult.  Call if questions.   Charlane FerrettiMelissa Ebbie Cherry, M.A., CCC/SLP Admission Coordinator  [facility]  Cell [phone]

## 2017-12-02 NOTE — H&P (Addendum)
Family Medicine Teaching [facility] Admission History and Physical Service Pager: [phone]  Patient name: Julie Bird Medical record number: [id number] Date of birth: September 29, 1965 Age: 52 y.o. Gender: female  Primary Care Provider: Truddie Coco, FNP Consultants: CCM Code Status: Full   Chief Complaint: Nausea/vomiting/abdominal pain x 2 months   Assessment and Plan: Julie Bird is a 52 y.o. female presenting with nausea, vomiting and abdominal pain x2 months. PMH is significant for T2 DM, HLD, COPD, hypothyroidism, hypertension, GERD, anxiety and depression.   N/V with mild abd pain.  Chronic x2 months.  On presentation, patient meeting sepsis criteria with hypotension to 80/30, tachypneic with RR 21-24, and tachycardic to 106.  Stable on home 4 L O2 per nasal cannula, no increase from home requirement, with sats 100%.  She was afebrile and without white count.  LA within normal.  She was given 4.5 L of LR bolus in the ED, however remained persistently hypotensive with systolics during 70-80s.  EKG with sinus tachycardia.  Chest x-ray negative for acute pulmonary process.  CT A/P with diffuse colitis, infectious and inflammatory causes should be considered.  In proximal descending region, new microperforation 1.5 cm in diameter, more characteristic of diverticulitis noted.  Also with RLL pneumonia noted, which is new from February.  Microperforation discussed with surgery while patient in ED.  Recommended medical management, bowel rest and IV antibiotics in addition to volume resuscitation.  Labs remarkable for hyponatremia to 126, chloride 88, lipase elevated to 91.  With report of diarrhea with weight loss, C. difficile was checked and was negative.  GI PCR panel negative.  Blood and urine cultures collected and she was started on IV Zosyn for empiric coverage. -Admit order placed for stepdown, attending Dr. McDiarmid >> admission pending CCM evaluation as she may not be appropriate for  stepdown given hypotension and poor response to fluid resuscitation in ED -- Start Rocephin/azithromycin to cover for CAP - Discussed pt with [facility] Dr. Marchelle Gearing.  Appreciate recommendations: Stat pro-calcitonin and trend, obtain random cortisol, 2L LR bolus, start neo-Synephrine, IV Solu-Cortef 100 mg x1, monitor blood pressures closely for improvement - CCM to evaluate -Surgery following, appreciate recommendations - Continue IV Zosyn - Trend pro-calcitonin - F/u random cortisol - Monitor blood pressures  Hypotension Hypotensive in ED to 70-80/30's and unresponsive to fluid resuscitation with 4.5 L boluses of lactated Ringer's.    Patient with history of hypertension per chart history, not on home medications. -See above for management  Poor po intake.  Patient with chronic diarrhea, nausea and vomiting for 2 months with history of unintentional 120 pound weight loss.  Has undergone outpatient workup with recent colonoscopy 9 months ago.  6/7 polyps found and recommend repeat colonoscopy in November 22, 2017. -Bowel rest per surgery recommendation -Fluid resuscitation as above  COPD, on 4 L at home Stable.  Satting 96% on 4 L in ED with comfortable work of breathing.  At home on [place], Daliresp, Singulair and Proventil BID prn.  -Continue home meds  Hypothyroidism. At home on levothyroxine 75 mcg daily. -Continue Synthroid  HLD.  At home on pravastatin 80 mg daily -Continue home med  Depression/anxiety.  Stable, denies SI/HI.  Patient at home on Risperdal 4 mg nightly and Zoloft 100 mg BID.  Takes Klonopin 2 mg as needed for panic attacks.  FEN/GI: Clear liquids, s/p 1L LR bolus x4.5  Prophylaxis:   Disposition: Admit to stepdown, pending CCM eval >> If admitted to the ICU, FPTS glad to resume care  once she is stable for floor.  History of Present Illness:  Julie Bird is a 52 y.o. female presenting with 2 months of nausea, vomiting and intermittent diarrhea.  She has recently had  a colonoscopy 9 months ago with removal of 7 polyps, and thereafter notices the symptoms.  She reports she has lost 120 pounds over the past few months due to her decreased appetite.  Is only able to keep yogurt down.  She has had outpatient workup with PCP and GI.  Was recommended to repeat colonoscopy April 2018.  She denies cough, fever, chills.  She denies shortness of breath and is comfortable on her home 4 L of oxygen in the ED.  Husband at bedside.  She endorses feeling dizzy, weak and tired lately.  Denies numbness tingling.  Review Of Systems: Per HPI with the following additions:  Review of Systems  Constitutional: Positive for malaise/fatigue and weight loss. Negative for fever.  Respiratory: Negative for cough.   Cardiovascular: Negative for chest pain and leg swelling.  Gastrointestinal: Positive for abdominal pain, nausea and vomiting. Negative for blood in stool.  Genitourinary: Negative for dysuria.  Skin: Negative for rash.  Neurological: Positive for dizziness and weakness.   Patient Active Problem List   Diagnosis Date Noted  . Pressure injury of skin 12/02/2017  . Hyponatremia 12/02/2017  . Colitis 12/02/2017  . History of diverticulitis 12/02/2017  . Hepatic steatosis 12/02/2017  . Hypomagnesemia 12/02/2017  . Hypoalbuminemia 12/02/2017  . Elevated procalcitonin 12/02/2017  . Leukocytosis 12/02/2017  . Thrombocytopenia (HCC) 12/02/2017  . Normocytic anemia 12/02/2017  . Pyuria 12/02/2017  . Hypotension 12/01/2017  . Dehydration   . Perforated diverticulum   . Pneumonia of right lower lobe due to infectious organism (HCC)   . Failure to thrive in adult   . Diarrhea in adult patient   . Splenomegaly 09/28/2017   Past Medical History: Past Medical History:  Diagnosis Date  . Asthma   . COPD (chronic obstructive pulmonary disease) (HCC)   . Diverticulitis   . Hypertension    Past Surgical History: Past Surgical History:  Procedure Laterality Date  .  CHOLECYSTECTOMY    . CYSTOSTOMY W/ BLADDER DILATION     Social History: Social History   Tobacco Use  . Smoking status: Former Games developer  . Smokeless tobacco: Never Used  Substance Use Topics  . Alcohol use: No    Frequency: Never  . Drug use: No   Additional social history: Lives at home with husband, history of tobacco use (quit 12 years ago), no alcohol or illicit drug use. Please also refer to relevant sections of EMR.  Family History: Family History  Problem Relation Age of Onset  . Diabetes Mother   . Hypertension Mother   . Alzheimer's disease Mother   . COPD Mother   . Emphysema Father   . Alzheimer's disease Father    Allergies and Medications: Allergies  Allergen Reactions  . Codeine Shortness Of Breath  . Lortab [Hydrocodone-Acetaminophen] Shortness Of Breath  . Morphine And Related Shortness Of Breath   No current facility-administered medications on file prior to encounter.    Current Outpatient Medications on File Prior to Encounter  Medication Sig Dispense Refill  . albuterol (PROVENTIL) (2.5 MG/3ML) 0.083% nebulizer solution Take 2.5 mg by nebulization 2 (two) times daily as needed for wheezing or shortness of breath.    . benzonatate (TESSALON) 100 MG capsule Take 100 mg by mouth 3 (three) times daily as needed for cough.     Marland Kitchen  Cholecalciferol (VITAMIN D3) 50000 units CAPS Take 1 capsule by mouth every Monday, Wednesday, and Friday.     Marland Kitchen CLARITIN 10 MG tablet Take 10 mg by mouth at bedtime.    . clonazePAM (KLONOPIN) 2 MG tablet Take 2 mg by mouth 4 (four) times daily as needed for anxiety.     Marland Kitchen DALIRESP 500 MCG TABS tablet Take 500 mg by mouth daily.    . ferrous sulfate 324 (65 Fe) MG TBEC TAKE 1 TABLET BY MOUTH TWICE A DAY  3  . fluticasone (FLONASE) 50 MCG/ACT nasal spray Place 2 sprays into both nostrils daily.    . folic acid (FOLVITE) 1 MG tablet Take 2 mg by mouth every morning.    . furosemide (LASIX) 20 MG tablet Take 20 mg by mouth every  morning.    Marland Kitchen levothyroxine (SYNTHROID, LEVOTHROID) 75 MCG tablet Take 75 mcg by mouth daily.    . Liniments (MUSCLE RUB MAXIMUM STRENGTH EX) Apply to affected areas up to two times a day- left shoulder and both knees    . metFORMIN (GLUCOPHAGE) 500 MG tablet Take 500 mg by mouth at bedtime as needed (for high BGL).     Marland Kitchen montelukast (SINGULAIR) 10 MG tablet Take 10 mg by mouth at bedtime.     Marland Kitchen nystatin cream (MYCOSTATIN) Apply 1 application topically daily. UNDER BOTH BREASTS    . potassium chloride (KLOR-CON) 8 MEQ tablet Take 24 mEq by mouth every morning.    . pravastatin (PRAVACHOL) 80 MG tablet Take 80 mg by mouth at bedtime.    . predniSONE (DELTASONE) 5 MG tablet Take 5 mg by mouth every morning.    Marland Kitchen PROAIR HFA 108 (90 Base) MCG/ACT inhaler Inhale 1-2 puffs into the lungs every 4 (four) hours as needed for wheezing or shortness of breath.     . promethazine (PHENERGAN) 25 MG tablet Take 25 mg by mouth every 8 (eight) hours as needed for nausea or vomiting.     . ranitidine (ZANTAC) 300 MG tablet Take 300 mg by mouth 2 (two) times daily.    . risperidone (RISPERDAL) 4 MG tablet Take 4 mg by mouth at bedtime.    . sertraline (ZOLOFT) 100 MG tablet Take 100 mg by mouth 2 (two) times daily.     Monte Fantasia INHUB 500-50 MCG/DOSE AEPB Inhale 1 puff into the lungs 2 (two) times daily.    . ciprofloxacin (CIPRO) 500 MG tablet Take 1 tablet (500 mg total) by mouth 2 (two) times daily. (Patient not taking: Reported on 12/01/2017) 14 tablet 0  . metroNIDAZOLE (FLAGYL) 500 MG tablet Take 1 tablet (500 mg total) by mouth 3 (three) times daily. (Patient not taking: Reported on 12/01/2017) 21 tablet 0    Objective: BP 125/64   Pulse 91   Temp 99 F (37.2 C) (Oral)   Resp (!) 21   Ht 5\' 5"  (1.651 m)   Wt 248 lb 14.4 oz (112.9 kg)   SpO2 100%   BMI 41.42 kg/m    Exam: General: 52 year old female, NAD, nasal cannula in place Eyes: EOMI, PERRLA ENTM: Dry mucous membranes, dry blood noted  bilaterally in nares, oral thrush present Neck: Supple, no JVD Cardiovascular: RRR no MRG, 2+ pedal pulses Respiratory: CTA B, normal effort, no wheeze or crackles Gastrointestinal: Soft, obese, mild TTP over RUQ, bowel sounds present MSK: No edema Derm: Warm, dry, no rash Neuro: Alert, oriented x3, no focal deficits, motor strength 5/5 bilaterally in upper and lower extremities,  normal sensation Psych: Normal mood and affect  Labs and Imaging: CBC BMET  Recent Labs  Lab 12/02/17 0429  WBC 12.7*  HGB 9.3*  HCT 29.6*  PLT 141*   Recent Labs  Lab 12/02/17 0745  NA 129*  K 3.6  CL 96*  CO2 23  BUN 7  CREATININE 0.49  GLUCOSE 95  CALCIUM 7.5*     Ct Abdomen Pelvis W Contrast  Result Date: 12/01/2017 CLINICAL DATA:  Continued surveillance diverticulitis. Nausea, vomiting, and diarrhea for 4 months. EXAM: CT ABDOMEN AND PELVIS WITH CONTRAST TECHNIQUE: Multidetector CT imaging of the abdomen and pelvis was performed using the standard protocol following bolus administration of intravenous contrast. CONTRAST:  100mL ISOVUE-300 IOPAMIDOL (ISOVUE-300) INJECTION 61% COMPARISON:  09/28/2017. FINDINGS: Lower chest: RIGHT lower lobe infiltrate. This is a new finding from February. This affects primarily the posterior basal segment of the RIGHT lower lobe. No significant effusion. LEFT lung clear. Hepatobiliary: Steatosis. No focal liver abnormality. Cholecystectomy. Pancreas: Unremarkable. No pancreatic ductal dilatation or surrounding inflammatory changes. Low Spleen: Splenomegaly.  No focal lesions. Adrenals/Urinary Tract: Adrenal glands are unremarkable. Kidneys are normal, without renal calculi, focal lesion, or hydronephrosis. Bladder is unremarkable. Stomach/Bowel: Mild changes of descending/sigmoid colon colitis, with bowel wall thickening and reduction in luminal caliber. Mild mesenteric edema. Small 1.5 cm microperforation is seen at the proximal sigmoid region, series 3, image 80, more  characteristic of diverticulitis. The ascending and transverse colon demonstrate bowel wall edema, new/increased from priors, raising the question of diffuse colitis. Infectious and inflammatory causes of colitis should be considered. No bowel obstruction. No free air or significant free fluid. Vascular/Lymphatic: Aortic atherosclerosis without enlarged abdominal or pelvic lymph nodes. Reproductive: Uterus and bilateral adnexa are unremarkable. Other: Small periumbilical paramedian hernia containing fat. Slight associated mesenteric edema. Musculoskeletal: No acute or significant osseous findings. IMPRESSION: Mild increase in diffuse bowel wall thickening affecting not only the LEFT, but RIGHT and transverse colonic regions raising the question of diffuse colitis. Infectious and inflammatory causes should be considered. In the proximal descending region, there is a new microperforation, 1.5 cm in diameter, more characteristic of diverticulitis. Diverticulosis with diverticulitis was demonstrated on the previous scan, but may be mildly increased. Splenomegaly.  Hepatic steatosis. RIGHT lower lobe pneumonia, new from February. Electronically Signed   By: Elsie StainJohn T Curnes M.D.   On: 12/01/2017 15:12   Dg Chest Port 1 View  Result Date: 12/01/2017 CLINICAL DATA:  Shortness of breath and cough EXAM: PORTABLE CHEST 1 VIEW COMPARISON:  09/28/2017 FINDINGS: The heart size and mediastinal contours are within normal limits. Both lungs are clear. The visualized skeletal structures are unremarkable. IMPRESSION: No active disease. Electronically Signed   By: Alcide CleverMark  Lukens M.D.   On: 12/01/2017 12:34    Freddrick MarchAmin, Tahesha Skeet, MD 12/02/2017, 9:30 AM PGY-2, [facility] FPTS Intern pager: [phone], text pages welcome

## 2017-12-02 NOTE — Progress Notes (Signed)
  Echocardiogram 2D Echocardiogram has been performed.  Christoffer Currier T Gevork Ayyad 12/02/2017, 11:47 AM

## 2017-12-03 DIAGNOSIS — R7989 Other specified abnormal findings of blood chemistry: Secondary | ICD-10-CM

## 2017-12-03 DIAGNOSIS — D72829 Elevated white blood cell count, unspecified: Secondary | ICD-10-CM

## 2017-12-03 DIAGNOSIS — D649 Anemia, unspecified: Secondary | ICD-10-CM

## 2017-12-03 DIAGNOSIS — K760 Fatty (change of) liver, not elsewhere classified: Secondary | ICD-10-CM

## 2017-12-03 LAB — GLUCOSE, CAPILLARY
GLUCOSE-CAPILLARY: 91 mg/dL (ref 65–99)
GLUCOSE-CAPILLARY: 96 mg/dL (ref 65–99)
Glucose-Capillary: 82 mg/dL (ref 65–99)
Glucose-Capillary: 80 mg/dL (ref 65–99)

## 2017-12-03 LAB — BASIC METABOLIC PANEL
ANION GAP: 9 (ref 5–15)
BUN: 5 mg/dL — AB (ref 6–20)
CHLORIDE: 99 mmol/L — AB (ref 101–111)
CO2: 22 mmol/L (ref 22–32)
Calcium: 7.2 mg/dL — ABNORMAL LOW (ref 8.9–10.3)
Creatinine, Ser: 0.48 mg/dL (ref 0.44–1.00)
GFR calc Af Amer: >60 mL/min (ref >60)
GLUCOSE: 90 mg/dL (ref 65–99)
Potassium: 3.3 mmol/L — ABNORMAL LOW (ref 3.5–5.1)
Sodium: 130 mmol/L — ABNORMAL LOW (ref 135–145)

## 2017-12-03 LAB — MAGNESIUM: MAGNESIUM: 1.6 mg/dL — AB (ref 1.7–2.4)

## 2017-12-03 LAB — CBC
HEMATOCRIT: 25.5 % — AB (ref 36.0–46.0)
HEMOGLOBIN: 8.1 g/dL — AB (ref 12.0–15.0)
MCH: 28.1 pg (ref 26.0–34.0)
MCHC: 31.8 g/dL (ref 30.0–36.0)
MCV: 88.5 fL (ref 78.0–100.0)
Platelets: 104 10*3/uL — ABNORMAL LOW (ref 150–400)
RBC: 2.88 MIL/uL — ABNORMAL LOW (ref 3.87–5.11)
RDW: 14.3 % (ref 11.5–15.5)
WBC: 4.8 10*3/uL (ref 4.0–10.5)

## 2017-12-03 LAB — PROCALCITONIN: PROCALCITONIN: 0.28 ng/mL

## 2017-12-03 MED ORDER — SERTRALINE HCL 100 MG PO TABS
100.0000 mg | ORAL_TABLET | Freq: Two times a day (BID) | ORAL | Status: DC
  Administered 2017-12-03 – 2017-12-09 (×13): 100 mg via ORAL
  Filled 2017-12-03 (×13): qty 1

## 2017-12-03 MED ORDER — FLUTICASONE PROPIONATE 50 MCG/ACT NA SUSP
2.0000 | Freq: Every day | NASAL | Status: DC
  Administered 2017-12-03 – 2017-12-09 (×7): 2 via NASAL
  Filled 2017-12-03: qty 16

## 2017-12-03 MED ORDER — RISPERIDONE 2 MG PO TABS
4.0000 mg | ORAL_TABLET | Freq: Every day | ORAL | Status: DC
  Administered 2017-12-03 – 2017-12-08 (×6): 4 mg via ORAL
  Filled 2017-12-03 (×6): qty 2

## 2017-12-03 MED ORDER — VITAMIN D3 1.25 MG (50000 UT) PO CAPS
1.0000 | ORAL_CAPSULE | ORAL | Status: DC
  Filled 2017-12-03: qty 1

## 2017-12-03 MED ORDER — ROFLUMILAST 500 MCG PO TABS
500.0000 ug | ORAL_TABLET | Freq: Every day | ORAL | Status: DC
  Administered 2017-12-03 – 2017-12-09 (×7): 500 ug via ORAL
  Filled 2017-12-03 (×7): qty 1

## 2017-12-03 MED ORDER — CLONAZEPAM 1 MG PO TABS
1.0000 mg | ORAL_TABLET | Freq: Two times a day (BID) | ORAL | Status: DC | PRN
  Administered 2017-12-05: 1 mg via ORAL
  Filled 2017-12-03: qty 1

## 2017-12-03 MED ORDER — POTASSIUM CHLORIDE CRYS ER 20 MEQ PO TBCR
30.0000 meq | EXTENDED_RELEASE_TABLET | Freq: Once | ORAL | Status: AC
  Administered 2017-12-03: 15:00:00 30 meq via ORAL
  Filled 2017-12-03: qty 1

## 2017-12-03 MED ORDER — BENZONATATE 100 MG PO CAPS: 100.0000 mg | ORAL_CAPSULE | Freq: Three times a day (TID) | ORAL | Status: DC | PRN

## 2017-12-03 MED ORDER — LORATADINE 10 MG PO TABS
10.0000 mg | ORAL_TABLET | Freq: Every day | ORAL | Status: DC
  Administered 2017-12-03 – 2017-12-08 (×6): 10 mg via ORAL
  Filled 2017-12-03 (×6): qty 1

## 2017-12-03 NOTE — Consult Note (Signed)
Physical Medicine and Rehabilitation Consult Reason for Consult: Weakness Referring Physician: Garnette Gunner, MD   HPI: Julie Bird is a 52 y.o. female with pmh of COPD on supplemental oxygen, diverticulitis, HTN, GERD, anxiety/depression, hypothyroidism presented on 12/01/17 with pancolitis. History taken from chart review and patient. Pt presented with a 2 month history of nausea, vomiting, and diarrhea with inability to maintain oral intake and ~120 pound weight loss.  She was seen as an outpatient by GI and colonoscopy ordered. CT abd/pevis ordered, which showed diffuse colitis with microperforation and RLL pneumonia. Surgery was consulted, who recommend IVF, bowel rest, Zosyn and CCM consult for sepsis. GI was consulted and started workup. Patient continues to abx for colitis and PNA as well as a modified diet.  Hospital course further complicated by UTI, diastolic dysfunction, hypokalemia, hyponatremia, pancytopenia.  Patient required assistance iwht all ADLs prior to admission.   Review of Systems  Gastrointestinal: Positive for abdominal pain.  All other systems reviewed and are negative.  Past Medical History:  Diagnosis Date  . Asthma   . COPD (chronic obstructive pulmonary disease) (HCC)   . Diverticulitis   . Hypertension    Past Surgical History:  Procedure Laterality Date  . CHOLECYSTECTOMY    . CYSTOSTOMY W/ BLADDER DILATION     Family History  Problem Relation Age of Onset  . Diabetes Mother   . Hypertension Mother   . Alzheimer's disease Mother   . COPD Mother   . Emphysema Father   . Alzheimer's disease Father    Social History:  reports that she has quit smoking. She has never used smokeless tobacco. She reports that she does not drink alcohol or use drugs. Allergies:  Allergies  Allergen Reactions  . Codeine Shortness Of Breath  . Lortab [Hydrocodone-Acetaminophen] Shortness Of Breath  . Morphine And Related Shortness Of Breath   Medications  Prior to Admission  Medication Sig Dispense Refill  . albuterol (PROVENTIL) (2.5 MG/3ML) 0.083% nebulizer solution Take 2.5 mg by nebulization 2 (two) times daily as needed for wheezing or shortness of breath.    . benzonatate (TESSALON) 100 MG capsule Take 100 mg by mouth 3 (three) times daily as needed for cough.     . Cholecalciferol (VITAMIN D3) 50000 units CAPS Take 1 capsule by mouth every Monday, Wednesday, and Friday.     Marland Kitchen CLARITIN 10 MG tablet Take 10 mg by mouth at bedtime.    . clonazePAM (KLONOPIN) 2 MG tablet Take 2 mg by mouth 4 (four) times daily as needed for anxiety.     Marland Kitchen DALIRESP 500 MCG TABS tablet Take 500 mg by mouth daily.    . ferrous sulfate 324 (65 Fe) MG TBEC TAKE 1 TABLET BY MOUTH TWICE A DAY  3  . fluticasone (FLONASE) 50 MCG/ACT nasal spray Place 2 sprays into both nostrils daily.    . folic acid (FOLVITE) 1 MG tablet Take 2 mg by mouth every morning.    . furosemide (LASIX) 20 MG tablet Take 20 mg by mouth every morning.    Marland Kitchen levothyroxine (SYNTHROID, LEVOTHROID) 75 MCG tablet Take 75 mcg by mouth daily.    . Liniments (MUSCLE RUB MAXIMUM STRENGTH EX) Apply to affected areas up to two times a day- left shoulder and both knees    . metFORMIN (GLUCOPHAGE) 500 MG tablet Take 500 mg by mouth at bedtime as needed (for high BGL).     Marland Kitchen montelukast (SINGULAIR) 10 MG tablet Take 10  mg by mouth at bedtime.     Marland Kitchen. nystatin cream (MYCOSTATIN) Apply 1 application topically daily. UNDER BOTH BREASTS    . potassium chloride (KLOR-CON) 8 MEQ tablet Take 24 mEq by mouth every morning.    . pravastatin (PRAVACHOL) 80 MG tablet Take 80 mg by mouth at bedtime.    . predniSONE (DELTASONE) 5 MG tablet Take 5 mg by mouth every morning.    Marland Kitchen. PROAIR HFA 108 (90 Base) MCG/ACT inhaler Inhale 1-2 puffs into the lungs every 4 (four) hours as needed for wheezing or shortness of breath.     . promethazine (PHENERGAN) 25 MG tablet Take 25 mg by mouth every 8 (eight) hours as needed for nausea or  vomiting.     . ranitidine (ZANTAC) 300 MG tablet Take 300 mg by mouth 2 (two) times daily.    . risperidone (RISPERDAL) 4 MG tablet Take 4 mg by mouth at bedtime.    . sertraline (ZOLOFT) 100 MG tablet Take 100 mg by mouth 2 (two) times daily.     Monte Fantasia. WIXELA INHUB 500-50 MCG/DOSE AEPB Inhale 1 puff into the lungs 2 (two) times daily.    . ciprofloxacin (CIPRO) 500 MG tablet Take 1 tablet (500 mg total) by mouth 2 (two) times daily. (Patient not taking: Reported on 12/01/2017) 14 tablet 0  . metroNIDAZOLE (FLAGYL) 500 MG tablet Take 1 tablet (500 mg total) by mouth 3 (three) times daily. (Patient not taking: Reported on 12/01/2017) 21 tablet 0    Home: Home Living Family/patient expects to be discharged to:: Private residence Living Arrangements: Spouse/significant other, Other relatives, Other (Comment)(two grandchildren) Available Help at Discharge: Family, Available 24 hours/day Type of Home: House Home Access: Ramped entrance Home Layout: One level Bathroom Shower/Tub: Engineer, manufacturing systemsTub/shower unit Bathroom Toilet: Standard Home Equipment: Wheelchair - manual, Shower seat, Environmental consultantWalker - 2 wheels Additional Comments[facility]  Functional History: Prior Function Level of Independence: Needs assistance Gait / Transfers Assistance Needed: uses a w/c for mobility, requires assistance with transfers, requires assistance to propel ADL's / Homemaking Assistance Needed: requires assistance with bathing and dressing, can feed herself Comments: sleeps on the couch Functional Status:  Mobility: Bed Mobility Overal bed mobility: Needs Assistance Bed Mobility: Rolling, Supine to Sit, Sit to Supine Rolling: Max assist Supine to sit: +2 for physical assistance, Total assist Sit to supine: +2 for physical assistance, Total assist General bed mobility comments: pt unable to self assist using rails due to UE weakness, assist for all aspects and to position hip at EOB with bed  pad Transfers Overall transfer level: Needs assistance Equipment used: Rolling walker (2 wheeled) Transfers: Sit to/from Stand Sit to Stand: +2 physical assistance, Mod assist General transfer comment: assist to rise with gait belt and use of bed pad under hips, steadying assist with pt tolerating x 10 seconds before needing to return to sitting, unable to attempt side steps toward HOB.      ADL: ADL Overall ADL's : Needs assistance/impaired Eating/Feeding: Set up, Sitting Grooming: Wash/dry hands, Wash/dry face, Set up, Sitting Grooming Details (indicate cue type and reason): edentulous Upper Body Bathing: Maximal assistance, Sitting Lower Body Bathing: Total assistance, +2 for physical assistance, Sit to/from stand Upper Body Dressing : Moderate assistance, Sitting Lower Body Dressing: Total assistance, +2 for physical assistance, Sit to/from stand Toileting- ArchitectClothing Manipulation and Hygiene: Total assistance, Bed level Functional mobility during ADLs: (unable to pivot today)  Cognition: Cognition Overall Cognitive Status: Within Functional Limits  for tasks assessed Orientation Level: Oriented X4 Cognition Arousal/Alertness: Awake/alert Behavior During Therapy: WFL for tasks assessed/performed Overall Cognitive Status: Within Functional Limits for tasks assessed  Blood pressure 118/71, pulse 96, temperature 97.6 F (36.4 C), temperature source Oral, resp. rate 17, height 5\' 5"  (1.651 m), weight 118.6 kg (261 lb 7.5 oz), SpO2 100 %. Physical Exam  Constitutional: She appears well-developed.  Obese  HENT:  Head: Normocephalic and atraumatic.  Eyes: EOM are normal. Right eye exhibits no discharge. Left eye exhibits no discharge.  Neck: Normal range of motion. Neck supple.  Cardiovascular: Normal rate and regular rhythm.  Respiratory: Effort normal and breath sounds normal.  +Garfield  GI: Soft. Bowel sounds are normal.  Musculoskeletal: She exhibits edema (LUE). She exhibits no  tenderness.  Neurological: She is alert.  Motor: B/l UE 4-/5 proximal to distal B/L LE: HF 2/5, KE 2+/5, ADF 4/5  Psychiatric: Her affect is blunt. She is slowed.    Results for orders placed or performed during the hospital encounter of 12/01/17 (from the past 24 hour(s))  Glucose, capillary     Status: None   Collection Time: 12/03/17  5:11 PM  Result Value Ref Range   Glucose-Capillary 80 65 - 99 mg/dL  Glucose, capillary     Status: None   Collection Time: 12/03/17 10:08 PM  Result Value Ref Range   Glucose-Capillary 96 65 - 99 mg/dL   Comment 1 Notify RN    Comment 2 Document in Chart   CBC     Status: Abnormal   Collection Time: 12/04/17  5:47 AM  Result Value Ref Range   WBC 3.6 (L) 4.0 - 10.5 K/uL   RBC 2.64 (L) 3.87 - 5.11 MIL/uL   Hemoglobin 7.3 (L) 12.0 - 15.0 g/dL   HCT 16.1 (L) 09.6 - 04.5 %   MCV 89.0 78.0 - 100.0 fL   MCH 27.7 26.0 - 34.0 pg   MCHC 31.1 30.0 - 36.0 g/dL   RDW 40.9 81.1 - 91.4 %   Platelets 85 (L) 150 - 400 K/uL  Basic metabolic panel     Status: Abnormal   Collection Time: 12/04/17  5:47 AM  Result Value Ref Range   Sodium 132 (L) 135 - 145 mmol/L   Potassium 2.9 (L) 3.5 - 5.1 mmol/L   Chloride 99 (L) 101 - 111 mmol/L   CO2 24 22 - 32 mmol/L   Glucose, Bld 87 65 - 99 mg/dL   BUN <5 (L) 6 - 20 mg/dL   Creatinine, Ser 7.82 (L) 0.44 - 1.00 mg/dL   Calcium 7.3 (L) 8.9 - 10.3 mg/dL   GFR calc non Af Amer >60 >60 mL/min   GFR calc Af Amer >60 >60 mL/min   Anion gap 9 5 - 15  Glucose, capillary     Status: Abnormal   Collection Time: 12/04/17  8:02 AM  Result Value Ref Range   Glucose-Capillary 102 (H) 65 - 99 mg/dL  Glucose, capillary     Status: None   Collection Time: 12/04/17 12:16 PM  Result Value Ref Range   Glucose-Capillary 89 65 - 99 mg/dL  Type and screen [facility]     Status: None   Collection Time: 12/04/17  2:02 PM  Result Value Ref Range   ABO/RH(D) AB NEG    Antibody Screen NEG    Sample Expiration       12/07/2017 Performed at [facility], [address], [place], [place] [address]  ABO/Rh     Status: None (Preliminary result)   Collection Time: 12/04/17  2:02 PM  Result Value Ref Range   ABO/RH(D)      AB NEG Performed at [facility], [address], [place], [place] [address]    No results found.  Assessment/Plan: Diagnosis: Debility Labs independently reviewed.  Records reviewed and summated above.  1. Does the need for close, 24 hr/day medical supervision in concert with the patient's rehab needs make it unreasonable for this patient to be served in a less intensive setting? Potentially  2. Co-Morbidities requiring supervision/potential complications: COPD on supplemental oxygen (monitor RR and O2 sats with increased mobility), diverticulitis, HTN (monitor and provide prns in accordance with increased physical exertion and pain), GERD, anxiety/depression (ensure anxiety and resulting apprehension do not limit functional progress; consider prn medications if warranted), hypothyroidism (cont meds, ensure appropriate mood and energy level for therapies), UTI (cont abx), diastolic dysfunction (monitor for signs/symptoms of fluid overload), hypokalemia (continue to monitor and replete as necessary), hyponatremia (cont to monitor, treat if necessary), pancytopenia (cont to monitor, evaluate for offending agents)  3. Due to safety, skin/wound care, disease management, pain management and patient education, does the patient require 24 hr/day rehab nursing? Yes 4. Does the patient require coordinated care of a physician, rehab nurse, PT (1-2 hrs/day, 5 days/week) and OT (1-2 hrs/day, 5 days/week) to address physical and functional deficits in the context of the above medical diagnosis(es)? Potentially Addressing deficits in the following areas: balance, endurance, locomotion, strength, transferring, bowel/bladder control, bathing, dressing, toileting and psychosocial  support 5. Can the patient actively participate in an intensive therapy program of at least 3 hrs of therapy per day at least 5 days per week? Potentially 6. The potential for patient to make measurable gains while on inpatient rehab is good 7. Anticipated functional outcomes upon discharge from inpatient rehab are mod assist  with PT, mod assist with OT, n/a with SLP. 8. Estimated rehab length of stay to reach the above functional goals is: 19-24 days. 9. Anticipated D/C setting: Other 10. Anticipated post D/C treatments: SNF 11. Overall Rehab/Functional Prognosis: good  RECOMMENDATIONS: This patient's condition is appropriate for continued rehabilitative care in the following setting: Will ned to clarify baseline level of functioning as it appears patient required assistance with all ADLs.  It does not appear patient will be able to tolerate 3 hours of therapy at present.  Further, recommend workup for LUE edema and pancytopenia.  If patient at or near baseline level of functioning and family no longer able to provide required assistance, recommend SNF. Patient has agreed to participate in recommended program. Yes Note that insurance prior authorization may be required for reimbursement for recommended care.  Comment: Rehab Admissions Coordinator to follow up.  Maryla Morrow, MD, ABPMR 12/04/2017

## 2017-12-03 NOTE — Progress Notes (Signed)
Patient ID: Julie Bird, female   DOB: 1966/02/28, 52 y.o.   MRN: [id number]       Subjective: Patient with no pain this morning. Feeling better each day.  Objective: Vital signs in last 24 hours: Temp:  [97.8 F (36.6 C)-98.3 F (36.8 C)] 98.3 F (36.8 C) (04/13 0558) Pulse Rate:  [91-97] 91 (04/13 0558) Resp:  [16-21] 16 (04/13 0558) BP: (104-128)/(54-62) 120/60 (04/13 0558) SpO2:  [96 %-100 %] 100 % (04/13 0558) Weight:  [113.9 kg (251 lb 1.7 oz)] 113.9 kg (251 lb 1.7 oz) (04/12 1627) Last BM Date: 12/02/17  Intake/Output from previous day: 04/12 0701 - 04/13 0700 In: 400 [I.V.:400] Out: -  Intake/Output this shift: No intake/output data recorded.  PE: Heart: regular Lungs: some rhonchi noted at the bases Abd: morbidly obese, nontender to deep palpation.  Lab Results:  Recent Labs    12/01/17 1132 12/02/17 0429  WBC 9.5 12.7*  HGB 10.1* 9.3*  HCT 31.7* 29.6*  PLT 120* 141*   BMET Recent Labs    12/02/17 1743 12/02/17 2010  NA 132* 132*  K 4.9 3.2*  CL 102 99*  CO2 19* 23  GLUCOSE 89 106*  BUN 8 6  CREATININE 0.47 0.47  CALCIUM 7.5* 7.6*   PT/INR Recent Labs    12/01/17 1132  LABPROT 13.7  INR 1.06   CMP     Component Value Date/Time   NA 132 (L) 12/02/2017 2010   K 3.2 (L) 12/02/2017 2010   CL 99 (L) 12/02/2017 2010   CO2 23 12/02/2017 2010   GLUCOSE 106 (H) 12/02/2017 2010   BUN 6 12/02/2017 2010   CREATININE 0.47 12/02/2017 2010   CALCIUM 7.6 (L) 12/02/2017 2010   PROT 5.6 (L) 12/02/2017 0429   ALBUMIN 2.5 (L) 12/02/2017 0429   AST 20 12/02/2017 0429   ALT 12 (L) 12/02/2017 0429   ALKPHOS 82 12/02/2017 0429   BILITOT 0.6 12/02/2017 0429   GFRNONAA >60 12/02/2017 2010   GFRAA >60 12/02/2017 2010   Lipase     Component Value Date/Time   LIPASE 91 (H) 12/01/2017 1132       Studies/Results: Ct Abdomen Pelvis W Contrast  Result Date: 12/01/2017 CLINICAL DATA:  Continued surveillance diverticulitis. Nausea, vomiting, and  diarrhea for 4 months. EXAM: CT ABDOMEN AND PELVIS WITH CONTRAST TECHNIQUE: Multidetector CT imaging of the abdomen and pelvis was performed using the standard protocol following bolus administration of intravenous contrast. CONTRAST:  ISOVUE-300 IOPAMIDOL (ISOVUE-300) INJECTION 61% COMPARISON:  09/28/2017. FINDINGS: Lower chest: RIGHT lower lobe infiltrate. This is a new finding from February. This affects primarily the posterior basal segment of the RIGHT lower lobe. No significant effusion. LEFT lung clear. Hepatobiliary: Steatosis. No focal liver abnormality. Cholecystectomy. Pancreas: Unremarkable. No pancreatic ductal dilatation or surrounding inflammatory changes. Low Spleen: Splenomegaly.  No focal lesions. Adrenals/Urinary Tract: Adrenal glands are unremarkable. Kidneys are normal, without renal calculi, focal lesion, or hydronephrosis. Bladder is unremarkable. Stomach/Bowel: Mild changes of descending/sigmoid colon colitis, with bowel wall thickening and reduction in luminal caliber. Mild mesenteric edema. Small 1.5 cm microperforation is seen at the proximal sigmoid region, series 3, image 80, more characteristic of diverticulitis. The ascending and transverse colon demonstrate bowel wall edema, new/increased from priors, raising the question of diffuse colitis. Infectious and inflammatory causes of colitis should be considered. No bowel obstruction. No free air or significant free fluid. Vascular/Lymphatic: Aortic atherosclerosis without enlarged abdominal or pelvic lymph nodes. Reproductive: Uterus and bilateral adnexa are unremarkable. Other:  Small periumbilical paramedian hernia containing fat. Slight associated mesenteric edema. Musculoskeletal: No acute or significant osseous findings. IMPRESSION: Mild increase in diffuse bowel wall thickening affecting not only the LEFT, but RIGHT and transverse colonic regions raising the question of diffuse colitis. Infectious and inflammatory causes should  be considered. In the proximal descending region, there is a new microperforation, 1.5 cm in diameter, more characteristic of diverticulitis. Diverticulosis with diverticulitis was demonstrated on the previous scan, but may be mildly increased. Splenomegaly.  Hepatic steatosis. RIGHT lower lobe pneumonia, new from February. Electronically Signed   By: Elsie StainJohn T Curnes M.D.   On: 12/01/2017 15:12   Dg Chest Port 1 View  Result Date: 12/01/2017 CLINICAL DATA:  Shortness of breath and cough EXAM: PORTABLE CHEST 1 VIEW COMPARISON:  09/28/2017 FINDINGS: The heart size and mediastinal contours are within normal limits. Both lungs are clear. The visualized skeletal structures are unremarkable. IMPRESSION: No active disease. Electronically Signed   By: Alcide CleverMark  Lukens M.D.   On: 12/01/2017 12:34    Anti-infectives: Anti-infectives (From admission, onward)   Start     Dose/Rate Route Frequency Ordered Stop   12/02/17 0400  piperacillin-tazobactam (ZOSYN) IVPB 3.375 g     3.375 g 12.5 mL/hr over 240 Minutes Intravenous Every 8 hours 12/01/17 2236     12/01/17 2000  piperacillin-tazobactam (ZOSYN) IVPB 3.375 g  Status:  Discontinued     3.375 g 12.5 mL/hr over 240 Minutes Intravenous Every 8 hours 12/01/17 1232 12/01/17 2208   12/01/17 1930  azithromycin (ZITHROMAX) 500 mg in sodium chloride 0.9 % 250 mL IVPB  Status:  Discontinued     500 mg 250 mL/hr over 60 Minutes Intravenous Every 24 hours 12/01/17 1919 12/01/17 2208   12/01/17 1230  piperacillin-tazobactam (ZOSYN) IVPB 3.375 g     3.375 g 100 mL/hr over 30 Minutes Intravenous  Once 12/01/17 1216 12/01/17 1324       Assessment/Plan  Pancolitis  -On zosyn.  WBC 12.7 yesterday, but pain improved and feeling better; c diff and infectious panel negative -clear liquids as tolerated -GI following -no acute surgical intervention planned at this time  FEN - IVFs/NPO, but might be able to advance to clears VTE - SCDs/Lovenox ID - Zosyn  COPD/asthma -  stopped smoking 12 years  Dispo - will follow, cont medical management  LOS: 2 days   Stephanie Couphristopher M. Cliffton AstersWhite, M.D. [facility]

## 2017-12-03 NOTE — Progress Notes (Addendum)
Patient ID: Julie Bird, female   DOB: 1965/11/19, 52 y.o.   MRN: [id number]     Progress Note   Subjective   Feeling better, no significant pain today Having bowel movements which are soft but not overt diarrhea Cannot tolerate the clear liquids due to indigestion- would like other food  C. difficile quick scan negative, GI path panel negative, stool for lactoferrin positive, stool for O&P still pending  Day #3 IV Zosyn        Objective   Vital signs in last 24 hours: Temp:  [97.9 F (36.6 C)-98.3 F (36.8 C)] 98.3 F (36.8 C) (04/13 0558) Pulse Rate:  [91-97] 91 (04/13 0558) Resp:  [16-19] 16 (04/13 0558) BP: (104-128)/(54-62) 120/60 (04/13 0558) SpO2:  [96 %-100 %] 100 % (04/13 0558) Weight:  [251 lb 1.7 oz (113.9 kg)] 251 lb 1.7 oz (113.9 kg) (04/12 1627) Last BM Date: 12/02/17 General: Obese white female in NAD Heart:  Regular rate and rhythm; no murmurs Lungs: Respirations even and unlabored, lungs CTA bilaterally Abdomen:  Soft, obese, minimal left lower quadrant tenderness, and nondistended. Normal bowel sounds. Extremities:  Without edema. Neurologic:  Alert and oriented,  grossly normal neurologically. Psych:  Cooperative. Normal mood and affect.  Intake/Output from previous day: 04/12 0701 - 04/13 0700 In: 400 [I.V.:400] Out: -  Intake/Output this shift: No intake/output data recorded.  Lab Results: Recent Labs    12/01/17 1132 12/02/17 0429 12/03/17 1109  WBC 9.5 12.7* 4.8  HGB 10.1* 9.3* 8.1*  HCT 31.7* 29.6* 25.5*  PLT 120* 141* 104*   BMET Recent Labs    12/02/17 1743 12/02/17 2010 12/03/17 1109  NA 132* 132* 130*  K 4.9 3.2* 3.3*  CL 102 99* 99*  CO2 19* 23 22  GLUCOSE 89 106* 90  BUN 8 6 5*  CREATININE 0.47 0.47 0.48  CALCIUM 7.5* 7.6* 7.2*   LFT Recent Labs    12/02/17 0429  PROT 5.6*  ALBUMIN 2.5*  AST 20  ALT 12*  ALKPHOS 82  BILITOT 0.6   PT/INR Recent Labs    12/01/17 1132  LABPROT 13.7  INR 1.06     Studies/Results: Ct Abdomen Pelvis W Contrast  Result Date: 12/01/2017 CLINICAL DATA:  Continued surveillance diverticulitis. Nausea, vomiting, and diarrhea for 4 months. EXAM: CT ABDOMEN AND PELVIS WITH CONTRAST TECHNIQUE: Multidetector CT imaging of the abdomen and pelvis was performed using the standard protocol following bolus administration of intravenous contrast. CONTRAST:  100mL ISOVUE-300 IOPAMIDOL (ISOVUE-300) INJECTION 61% COMPARISON:  09/28/2017. FINDINGS: Lower chest: RIGHT lower lobe infiltrate. This is a new finding from February. This affects primarily the posterior basal segment of the RIGHT lower lobe. No significant effusion. LEFT lung clear. Hepatobiliary: Steatosis. No focal liver abnormality. Cholecystectomy. Pancreas: Unremarkable. No pancreatic ductal dilatation or surrounding inflammatory changes. Low Spleen: Splenomegaly.  No focal lesions. Adrenals/Urinary Tract: Adrenal glands are unremarkable. Kidneys are normal, without renal calculi, focal lesion, or hydronephrosis. Bladder is unremarkable. Stomach/Bowel: Mild changes of descending/sigmoid colon colitis, with bowel wall thickening and reduction in luminal caliber. Mild mesenteric edema. Small 1.5 cm microperforation is seen at the proximal sigmoid region, series 3, image 80, more characteristic of diverticulitis. The ascending and transverse colon demonstrate bowel wall edema, new/increased from priors, raising the question of diffuse colitis. Infectious and inflammatory causes of colitis should be considered. No bowel obstruction. No free air or significant free fluid. Vascular/Lymphatic: Aortic atherosclerosis without enlarged abdominal or pelvic lymph nodes. Reproductive: Uterus and bilateral adnexa are unremarkable.  Other: Small periumbilical paramedian hernia containing fat. Slight associated mesenteric edema. Musculoskeletal: No acute or significant osseous findings. IMPRESSION: Mild increase in diffuse bowel wall  thickening affecting not only the LEFT, but RIGHT and transverse colonic regions raising the question of diffuse colitis. Infectious and inflammatory causes should be considered. In the proximal descending region, there is a new microperforation, 1.5 cm in diameter, more characteristic of diverticulitis. Diverticulosis with diverticulitis was demonstrated on the previous scan, but may be mildly increased. Splenomegaly.  Hepatic steatosis. RIGHT lower lobe pneumonia, new from February. Electronically Signed   By: Elsie Stain M.D.   On: 12/01/2017 15:12       Assessment / Plan:     #36 52 year old white female with acute proximal descending diverticulitis with microperforation measuring 1.5 cm. CT on admission also showed some mild diffuse bowel wall thickening raising question of diffuse colitis factious or inflammatory. Infectious workup has been negative other than O&P which is pending and this is unlikely  Day #3 IV Zosyn-improving daily #2 splenomegaly #3 hepatic steatosis #4 right lower lobe pneumonia   Plan; start full liquids Continue IV Zosyn-patient will likely need to 6 days IV Zosyn and then reimaging prior to discharge given microperforation to rule out abscess development Follow-up pending stool studies No plans for colonoscopy this admission given acute diverticulitis and microperforation, Patient states she would like to transfer to gastroenterology here in [place].        Contact  Amy Esterwood, P.A.-C               [phone]     Attending physician's note   I have taken an interval history, reviewed the chart and examined the patient. I agree with the Advanced Practitioner's note, impression and recommendations.  Discussed in detail with the patient's husband.  Patient with acute sigmoid diverticulitis with microperforation without abscess on IV Zosyn.  Gradually improving.  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  Last  colonoscopy in [place], [place] 5-6 months ago which showed diverticulosis.  Plan: Continue antibiotics for total of 10-14 days.  Ambulate.  Stool was negative for C. difficile.  Edman Circle, MD

## 2017-12-03 NOTE — Progress Notes (Signed)
Family Medicine Teaching Service Daily Progress Note Intern Pager: [phone]  Patient name: Julie Bird Medical record number: [id number] Date of birth: October 27, 1965 Age: 52 y.o. Gender: female  Primary Care Provider: Truddie Coco, FNP Consultants: CCM, Surgery, GI Code Status: Full  Pt Overview and Major Events to Date:  Julie Bird is a 52 y.o. female presenting with nausea, vomiting and abdominal pain x2 months. PMH is significant for T2 DM, HLD, COPD, hypothyroidism, hypertension, GERD, anxiety and depression.   4/11 - Transferred to CCM and started on Neo for hypotension 4/13 - Transferred to FPTS   Assessment and Plan:  Pan-colitis with microperforation on chronic abdominal pain and nausea/diarhea Abdominal pain has resolved. Pt tolerating clear liquid PO. CT abdomen on admission showed pan-colitis. Ddx infectious vs. Inflammatory bowel disease. Also showed 1.5 cm microperforation associated with diverticultis. Surgery recommended non-operative management and continued abx at this time. Stool studies positive fecal lactoferin, negative stool biofire, neg Cdiff, O&P is pending. WBC down trending  -appreciate GI recommendations - cont Zosyn (/11-) for 6 days, with planned CT abdomen prior to reevaluate microperforation,  No endoscopy during current admission due to microperforation -cont full liquid diet  Right lower lobe pneumonia Found on abdominal CT, no visualize on CXR. Pt is SORA. Currently on Zosyn. Pt was MRSA negative, but given clinically improvement, no need to broaden coverage.  -cont to monitor respiratory status - cont Zosyn. - stool osmal, K, Na pending  UTI with Ecoli, sensitivities pending UA moderate lueks, neg nitrites. Uculture pos for e coli, sensitivies pending.  - cont zosyn, monitor for sensitivities  HFpEF, diastolic dysfunction Due to increasing dyspnea on admission, TTE was obtained. Showed EF 55-60%, mild basal hypertrophy.  -Daily weights -strict  I/Os  Hypotesnion Currently hemodynamically stable.  - cont monitor BP  Severe Calorie malnutrition Likely malabsorptive from chronic diarrhea. Mg low 1.2 on admission. S/p replacement -will recheck Mg and replete as necessary  COPD, stable On 4L [place] O2 which is her home requirement. Uses albuterol nebs prn, fluticasone, daliresp, claritin, singular - cont home albuterol prn, fluticsasone, daliresp, claritin - montitor work of breathing.   Hypothyroidism - cont syntrhoid  HLD Cont statin  Depression/Anxiety Takes zoloft 100 mg bid, risperidone4mg  qhe, klonopin 2 mg four times a day prn - cont zoloft, risperidone - clonazepam 1 mg bid prn  FEN/GI: clear liquids PPx: LMWH  Disposition: Inpatient until improved PO intake, eval for CIR vs. SNF likely monday  Subjective:  Patient says she feels better. She wants to go back home soon. Not able to eat chicken broth because of taste, but willing to try other liquids.   Objective: Temp:  [97.9 F (36.6 C)-98.3 F (36.8 C)] 98.3 F (36.8 C) (04/13 0558) Pulse Rate:  [91-97] 91 (04/13 0558) Resp:  [16-19] 16 (04/13 0558) BP: (104-128)/(54-62) 120/60 (04/13 0558) SpO2:  [96 %-100 %] 100 % (04/13 0558) Weight:  [251 lb 1.7 oz (113.9 kg)] 251 lb 1.7 oz (113.9 kg) (04/12 1627) Physical Exam: Gen: morbidly obese, NAD, resting comfortably CV: RRR with no murmurs appreciated Pulm: 4L O2 [place], NWOB,  GI: large central pannis, Soft, Nontender,  MSK: no edema, cyanosis, or clubbing noted Skin: warm, dry Neuro: grossly normal, moves all extremities Psych: Normal affect and thought content  Laboratory: Recent Labs  Lab 12/01/17 1132 12/02/17 0429 12/03/17 1109  WBC 9.5 12.7* 4.8  HGB 10.1* 9.3* 8.1*  HCT 31.7* 29.6* 25.5*  PLT 120* 141* 104*   Recent Labs  Lab 12/01/17  1132  12/02/17 0429  12/02/17 1743 12/02/17 2010 12/03/17 1109  NA 126*   < > 130*   < > 132* 132* 130*  K 4.2   < > 4.2   < > 4.9 3.2* 3.3*  CL 88*   <  > 97*   < > 102 99* 99*  CO2 24   < > 21*   < > 19* 23 22  BUN 10   < > 8   < > 8 6 5*  CREATININE 0.73   < > 0.47   < > 0.47 0.47 0.48  CALCIUM 8.8*   < > 7.9*   < > 7.5* 7.6* 7.2*  PROT 6.3*  --  5.6*  --   --   --   --   BILITOT 0.7  --  0.6  --   --   --   --   ALKPHOS 100  --  82  --   --   --   --   ALT 12*  --  12*  --   --   --   --   AST 23  --  20  --   --   --   --   GLUCOSE 115*   < > 103*   < > 89 106* 90   < > = values in this interval not displayed.   Garnette Gunnerhompson, Jasim Harari B, MD 12/03/2017, 1:55 PM PGY-1, [facility] FPTS Intern pager: [phone], text pages welcome

## 2017-12-04 ENCOUNTER — Inpatient Hospital Stay (HOSPITAL_COMMUNITY): Payer: Medicaid - Out of State

## 2017-12-04 ENCOUNTER — Encounter (HOSPITAL_COMMUNITY): Payer: Self-pay | Admitting: Radiology

## 2017-12-04 DIAGNOSIS — D61818 Other pancytopenia: Secondary | ICD-10-CM

## 2017-12-04 DIAGNOSIS — F329 Major depressive disorder, single episode, unspecified: Secondary | ICD-10-CM

## 2017-12-04 DIAGNOSIS — Z9981 Dependence on supplemental oxygen: Secondary | ICD-10-CM

## 2017-12-04 DIAGNOSIS — J449 Chronic obstructive pulmonary disease, unspecified: Secondary | ICD-10-CM

## 2017-12-04 DIAGNOSIS — N390 Urinary tract infection, site not specified: Secondary | ICD-10-CM

## 2017-12-04 DIAGNOSIS — E876 Hypokalemia: Secondary | ICD-10-CM

## 2017-12-04 DIAGNOSIS — F419 Anxiety disorder, unspecified: Secondary | ICD-10-CM

## 2017-12-04 DIAGNOSIS — E039 Hypothyroidism, unspecified: Secondary | ICD-10-CM

## 2017-12-04 DIAGNOSIS — I5189 Other ill-defined heart diseases: Secondary | ICD-10-CM

## 2017-12-04 DIAGNOSIS — E871 Hypo-osmolality and hyponatremia: Secondary | ICD-10-CM

## 2017-12-04 DIAGNOSIS — K5720 Diverticulitis of large intestine with perforation and abscess without bleeding: Principal | ICD-10-CM

## 2017-12-04 DIAGNOSIS — I10 Essential (primary) hypertension: Secondary | ICD-10-CM

## 2017-12-04 DIAGNOSIS — D696 Thrombocytopenia, unspecified: Secondary | ICD-10-CM

## 2017-12-04 LAB — CBC
HEMATOCRIT: 23.5 % — AB (ref 36.0–46.0)
HEMATOCRIT: 24.1 % — AB (ref 36.0–46.0)
Hemoglobin: 7.3 g/dL — ABNORMAL LOW (ref 12.0–15.0)
Hemoglobin: 7.6 g/dL — ABNORMAL LOW (ref 12.0–15.0)
MCH: 27.7 pg (ref 26.0–34.0)
MCH: 27.5 pg (ref 26.0–34.0)
MCHC: 31.1 g/dL (ref 30.0–36.0)
MCHC: 31.5 g/dL (ref 30.0–36.0)
MCV: 89 fL (ref 78.0–100.0)
MCV: 87.3 fL (ref 78.0–100.0)
PLATELETS: 85 10*3/uL — AB (ref 150–400)
Platelets: 82 10*3/uL — ABNORMAL LOW (ref 150–400)
RBC: 2.64 MIL/uL — ABNORMAL LOW (ref 3.87–5.11)
RBC: 2.76 MIL/uL — ABNORMAL LOW (ref 3.87–5.11)
RDW: 14.3 % (ref 11.5–15.5)
RDW: 14.1 % (ref 11.5–15.5)
WBC: 3.6 10*3/uL — AB (ref 4.0–10.5)
WBC: 4.2 10*3/uL (ref 4.0–10.5)

## 2017-12-04 LAB — BASIC METABOLIC PANEL
ANION GAP: 9 (ref 5–15)
BUN: <5 mg/dL — ABNORMAL LOW (ref 6–20)
CALCIUM: 7.3 mg/dL — AB (ref 8.9–10.3)
CO2: 24 mmol/L (ref 22–32)
Chloride: 99 mmol/L — ABNORMAL LOW (ref 101–111)
Creatinine, Ser: 0.39 mg/dL — ABNORMAL LOW (ref 0.44–1.00)
Glucose, Bld: 87 mg/dL (ref 65–99)
Potassium: 2.9 mmol/L — ABNORMAL LOW (ref 3.5–5.1)
Sodium: 132 mmol/L — ABNORMAL LOW (ref 135–145)

## 2017-12-04 LAB — URINE CULTURE

## 2017-12-04 LAB — GLUCOSE, CAPILLARY
Glucose-Capillary: 102 mg/dL — ABNORMAL HIGH (ref 65–99)
Glucose-Capillary: 89 mg/dL (ref 65–99)
Glucose-Capillary: 80 mg/dL (ref 65–99)
Glucose-Capillary: 91 mg/dL (ref 65–99)

## 2017-12-04 LAB — TYPE AND SCREEN
ABO/RH(D): AB NEG
Antibody Screen: NEGATIVE

## 2017-12-04 LAB — ABO/RH: ABO/RH(D): AB NEG

## 2017-12-04 MED ORDER — FUROSEMIDE 10 MG/ML IJ SOLN
20.0000 mg | Freq: Once | INTRAMUSCULAR | Status: AC
  Administered 2017-12-05: 20 mg via INTRAVENOUS
  Filled 2017-12-04: qty 2

## 2017-12-04 MED ORDER — MAGNESIUM SULFATE 2 GM/50ML IV SOLN
2.0000 g | Freq: Once | INTRAVENOUS | Status: AC
  Administered 2017-12-04: 2 g via INTRAVENOUS
  Filled 2017-12-04: qty 50

## 2017-12-04 MED ORDER — IOPAMIDOL (ISOVUE-300) INJECTION 61%
INTRAVENOUS | Status: AC
  Filled 2017-12-04: qty 100

## 2017-12-04 MED ORDER — IOPAMIDOL (ISOVUE-300) INJECTION 61%
100.0000 mL | Freq: Once | INTRAVENOUS | Status: AC | PRN
  Administered 2017-12-04: 100 mL via INTRAVENOUS

## 2017-12-04 MED ORDER — MEROPENEM 1 G IV SOLR
1.0000 g | Freq: Three times a day (TID) | INTRAVENOUS | Status: DC
  Administered 2017-12-04 – 2017-12-08 (×12): 1 g via INTRAVENOUS
  Filled 2017-12-04 (×13): qty 1

## 2017-12-04 MED ORDER — ALBUMIN HUMAN 25 % IV SOLN
12.5000 g | Freq: Once | INTRAVENOUS | Status: AC
  Administered 2017-12-05: 12.5 g via INTRAVENOUS
  Filled 2017-12-04: qty 50

## 2017-12-04 MED ORDER — POTASSIUM CHLORIDE CRYS ER 20 MEQ PO TBCR
40.0000 meq | EXTENDED_RELEASE_TABLET | Freq: Two times a day (BID) | ORAL | Status: AC
  Administered 2017-12-04 (×2): 40 meq via ORAL
  Filled 2017-12-04 (×2): qty 2

## 2017-12-04 MED ORDER — PREDNISONE 5 MG PO TABS
5.0000 mg | ORAL_TABLET | Freq: Every day | ORAL | Status: DC
  Administered 2017-12-04 – 2017-12-05 (×2): 5 mg via ORAL
  Filled 2017-12-04 (×2): qty 1

## 2017-12-04 MED ORDER — SODIUM CHLORIDE 0.9 % IV SOLN
1.0000 g | Freq: Three times a day (TID) | INTRAVENOUS | Status: DC
  Filled 2017-12-04: qty 1

## 2017-12-04 MED ORDER — IOPAMIDOL (ISOVUE-300) INJECTION 61%
INTRAVENOUS | Status: AC
  Administered 2017-12-04: 30 mL
  Filled 2017-12-04: qty 30

## 2017-12-04 NOTE — Progress Notes (Addendum)
Patient ID: Julie Bird, female   DOB: Dec 15, 1965, 52 y.o.   MRN: [id number]    Progress Note   Subjective   Day #3 hospital- Zosyn/ Zithromax Weak, but generally feeling a little better- family in room- has not been out of bed Diarrhea improved- soft stools  Husband says she has been dealing with abdominal  pain, and diarrhea for 8-9 months - had Colonoscopy with Julie Bird Julie Bird/[place] about 9 months ago. Treated with abx off and on but no improvement  in sxs and worse past 2 months - poor appetite , debilitated    Objective   Vital signs in last 24 hours: Temp:  [97.6 F (36.4 C)-98.2 F (36.8 C)] 98.2 F (36.8 C) (04/14 0500) Pulse Rate:  [93-98] 93 (04/14 0500) Resp:  [17] 17 (04/14 0500) BP: (110-133)/(58-63) 114/61 (04/14 0500) SpO2:  [98 %-100 %] 98 % (04/14 0912) Weight:  [261 lb 7.5 oz (118.6 kg)] 261 lb 7.5 oz (118.6 kg) (04/14 0500) Last BM Date: 12/03/17 General:   Morbidly obese  white female in NAD Heart:  Regular rate and rhythm; no murmurs Lungs: Respirations even and unlabored, lungs CTA bilaterally Abdomen:  Soft, minimally tender lower abdomen,and nondistended. Normal bowel sounds. Extremities:  Without edema. Neurologic:  Alert and oriented,  grossly normal neurologically. Psych:  Cooperative. Normal mood and affect.  Intake/Output from previous day: 04/13 0701 - 04/14 0700 In: 4800 [I.V.:4500; IV Piggyback:300] Out: -  Intake/Output this shift: No intake/output data recorded.  Lab Results: Recent Labs    12/02/17 0429 12/03/17 1109 12/04/17 0547  WBC 12.7* 4.8 3.6*  HGB 9.3* 8.1* 7.3*  HCT 29.6* 25.5* 23.5*  PLT 141* 104* 85*   BMET Recent Labs    12/02/17 2010 12/03/17 1109 12/04/17 0547  NA 132* 130* 132*  K 3.2* 3.3* 2.9*  CL 99* 99* 99*  CO2 23 22 24   GLUCOSE 106* 90 87  BUN 6 5* <5*  CREATININE 0.47 0.48 0.39*  CALCIUM 7.6* 7.2* 7.3*   LFT Recent Labs    12/02/17 0429  PROT 5.6*  ALBUMIN 2.5*  AST 20  ALT 12*  ALKPHOS  82  BILITOT 0.6   PT/INR No results for input(s): LABPROT, INR in the last 72 hours.  Studies/Results: No results found.     Assessment / Plan:    #611  52 year old white female with recently documented diverticulitis of URI 2019 now admitted with hypotensive shock/diffuse colitis and microperforation of the left colon concerning more for diverticulitis and diverticular microperforation on CT done at the time of admission. Patient had been having abdominal pain and diarrhea over the past several months worse over the past couple of months. Colonoscopy was done about 9 months ago/Julie. Paula Bird/[place]-we will need to get path report etc. Infectious workup negative this admission with negative C. difficile and negative GI pathogen panel, O and P pending Patient has been on IV Zosyn with improvement in symptoms and decrease in diarrhea   #2 splenomegaly and steatosis on CT scan- #3  Right lower lobe pneumonia #4 new UTI #4 history of colon polyps status post polypectomy at time of last colonoscopy #5COPD #6 morbid obesity #7 debilitation  #8 Pt  has had a drop in all of her blood counts over the past couple of days now pancytopenic-question secondary to Zosyn  Plan; start full liquid diet Continue IV antibiotics, Zosyn is being discontinued and she will be started on meropenem per pharmacy, will need to follow pancytopenia closely She will need  PT consultation, needs to start getting out of bed Eventually she may need repeat colonoscopy once diverticulitis/microperforation completely healed. I think she should have repeat imaging prior to discharge Repeat CBC in a.m.      Contact  Julie Bird, P.A.-C               [phone]   Attending physician's note   I have taken an interval history, reviewed the chart and examined the patient. I agree with the Advanced Practitioner's note, impression and recommendations.   Pt with acute diverticulitis with microperforation without  abscess on antibiotics. Had colonoscopy in [place] by Julie Bird 9 months ago.  May have early liver cirrhosis which can be evaluated as outpatient.   Plan: - Continue IV antibiotics (changed to meropenam) - Agree to repeat CT scan abdomen and pelvis just prior to discharge. - Check CBC in a.m. - Ambulate. - Advance diet     Julie Circle, MD

## 2017-12-04 NOTE — Progress Notes (Signed)
Family Medicine Teaching Service Daily Progress Note Intern Pager: [phone]  Patient name: Julie Gammonracy Egnor Medical record number: [id number] Date of birth: 02/11/1966 Age: 52 y.o. Gender: female  Primary Care Provider: Truddie CocoMcClure, Karen, FNP Consultants: CCM, Surgery, GI Code Status: Full  Pt Overview and Major Events to Date:  Julie Bird is a 52 y.o. female presenting with nausea, vomiting and abdominal pain x2 months. PMH is significant for T2 DM, HLD, COPD, hypothyroidism, hypertension, GERD, anxiety and depression.   4/11 - Transferred to [facility]CM and started on Neo for hypotension 4/13 - Transferred to FPTS   Assessment and Plan:  Pan-colitis with microperforation on chronic abdominal pain and nausea/diarhea Abdominal pain has resolved. Pt tolerating clear liquid PO. CT abdomen on admission showed pan-colitis, also showed 1.5 cm microperforation associated with diverticultis. Surgery recommended non-operative management and continued abx at this time. Stool studies positive fecal lactoferin, negative FOBT, neg C.diff. WBC down trending  --Appreciate GI recommendations --Discontinue Zosyn today (4/15)  ( Start 4/11) 5 days --Started on Meropenem 1 g q8 --Follow up on O&P --Continue full liquid diet  Anemia Patient hemoglobin continue to drop since admission. Patient with microperforation without any current signs of obvious bleeding. Likely dilutional from significant volume resuscitation in the setting of ICU stay for hypotension. FOBT negative, however will need to rule out active GI bleed. --Order CT Abdomen pelvis w/ contrast  --Type and screen --Repeat CBC in the afternoon --Threshold for transfusion should be Hbg 7 or below  Hypokalemia 2.9 this morning, likely secondary to multiple episodes of emesis in the settings of colitis. --Follow up on am BMP --Replete as needed --Give KDur 40 mEq x2  Right lower lobe pneumonia Noted abdominal CT, not visualized on CXR. Pt is SORA.  Currently on Meropenem. Procalcitonin 0.28. --Contine to monitor respiratory status --Continue meropenem  UTI with Ecoli, sensitivities pending UA moderate leuks, neg nitrites. Urine culture pos for e coli . 100K colonies, resistant to multiple antibiotic. Likely ESBL based on susceptibility profile. Zosyn discontinued. --Patient started on Meropenem 1 g  q8 --Awaiting final culture   HFpEF, diastolic dysfunction Due to increasing dyspnea on admission, TTE was obtained. Showed EF 55-60%, mild basal hypertrophy.  --Daily weights --strict I/Os  Hypotension Currently hemodynamically stable. BP 118/71 --Will continue to monitor  Severe Calorie malnutrition Likely malabsorptive from chronic diarrhea. Last Magnesium low 1.6  -will recheck Mg and replete as necessary  COPD, stable On 4L [place] O2 which is her home requirement. Uses albuterol nebs prn, fluticasone, daliresp, claritin, singular --Continue lbuterol prn, fluticasone, Dulera, Singulair, roflumilast, claritin --Restarted prednisone 5 mg daily  Hypothyroidism --Continue syntrhoid  HLD --Continue statin  Depression/Anxiety Takes zoloft 100 mg bid, risperidone 4mg  qhs, klonopin 2 mg four times a day prn. --Continue zoloft, risperidone --Clonazepam 1 mg bid prn  Deconditioning Concern for deconditioning given body habitus, minimal activity since admission, ICU and multiple medical problems. Consult for CIR evaluation will be placed. --Follow up on CIR evaluation   FEN/GI: clear liquids PPx: LMWH  Disposition: Inpatient until improved PO intake, eval for CIR vs. SNF likely monday  Subjective:  Patient says she feels better. No acute complaints for her diets, understand she will need to be advance slowly.  Objective: Temp:  [97.6 F (36.4 C)-98.2 F (36.8 C)] 97.6 F (36.4 C) (04/14 1431) Pulse Rate:  [93-96] 96 (04/14 1431) Resp:  [17] 17 (04/14 0500) BP: (110-118)/(61-71) 118/71 (04/14 1431) SpO2:  [98 %-100 %]  100 % (04/14 1431) Weight:  [562[261  lb 7.5 oz (118.6 kg)] 261 lb 7.5 oz (118.6 kg) (04/14 0500)   Physical Exam: Gen: morbidly obese, NAD, resting comfortably CV: RRR with no murmurs appreciated Pulm: 4L O2 [place], NWOB,  GI: large central pannis, Soft, Nontender,  MSK: no edema, cyanosis, or clubbing noted Skin: warm, dry Extremity: Left air edematous without any pitting Neuro: grossly normal, moves all extremities Psych: Normal affect and thought content  Laboratory: Recent Labs  Lab 12/02/17 0429 12/03/17 1109 12/04/17 0547  WBC 12.7* 4.8 3.6*  HGB 9.3* 8.1* 7.3*  HCT 29.6* 25.5* 23.5*  PLT 141* 104* 85*   Recent Labs  Lab 12/01/17 1132  12/02/17 0429  12/02/17 2010 12/03/17 1109 12/04/17 0547  NA 126*   < > 130*   < > 132* 130* 132*  K 4.2   < > 4.2   < > 3.2* 3.3* 2.9*  CL 88*   < > 97*   < > 99* 99* 99*  CO2 24   < > 21*   < > 23 22 24   BUN 10   < > 8   < > 6 5* <5*  CREATININE 0.73   < > 0.47   < > 0.47 0.48 0.39*  CALCIUM 8.8*   < > 7.9*   < > 7.6* 7.2* 7.3*  PROT 6.3*  --  5.6*  --   --   --   --   BILITOT 0.7  --  0.6  --   --   --   --   ALKPHOS 100  --  82  --   --   --   --   ALT 12*  --  12*  --   --   --   --   AST 23  --  20  --   --   --   --   GLUCOSE 115*   < > 103*   < > 106* 90 87   < > = values in this interval not displayed.   Lovena Neighbours, MD 12/04/2017, 3:33 PM PGY-2, [place] [facility] FPTS Intern pager: [phone], text pages welcome

## 2017-12-04 NOTE — [place] FL2 (Signed)
[place] MEDICAID FL2 LEVEL OF CARE SCREENING TOOL     IDENTIFICATION  Patient Name: Julie Bird Birthdate: April 01, 1966 Sex: female Admission Date (Current Location): 12/01/2017  [place]:    [id number] Facility and Address:  [facility], [address], [place], [place] [address]      Provider Number: [id number]  Attending Physician Name and Address:  Latrelle DodrillMcIntyre, Brittany J, MD  Relative Name and Phone Number:  Peri MarisRichard Durley; [phone]    Current Level of Care: Hospital Recommended Level of Care: Skilled Nursing Facility Prior Approval Number:    Date Approved/Denied:   PASRR Number:    Discharge Plan: SNF    Current Diagnoses: Patient Active Problem List   Diagnosis Date Noted  . Pressure injury of skin 12/02/2017  . Hyponatremia 12/02/2017  . Colitis 12/02/2017  . History of diverticulitis 12/02/2017  . Hepatic steatosis 12/02/2017  . Hypomagnesemia 12/02/2017  . Hypoalbuminemia 12/02/2017  . Elevated procalcitonin 12/02/2017  . Leukocytosis 12/02/2017  . Thrombocytopenia (HCC) 12/02/2017  . Normocytic anemia 12/02/2017  . Pyuria 12/02/2017  . Protein-calorie malnutrition, severe 12/02/2017  . Diverticulitis of colon with perforation 12/02/2017  . Hypotension 12/01/2017  . Dehydration   . Perforated diverticulum   . Pneumonia of right lower lobe due to infectious organism (HCC)   . Failure to thrive in adult   . Diarrhea in adult patient   . Splenomegaly 09/28/2017    Orientation RESPIRATION BLADDER Height & Weight     Self, Time, Situation, Place  O2(2L nasal canula) Continent Weight: 261 lb 7.5 oz (118.6 kg) Height:  5\' 5"  (165.1 cm)  BEHAVIORAL SYMPTOMS/MOOD NEUROLOGICAL BOWEL NUTRITION STATUS      Continent Diet(see discharge summary)  AMBULATORY STATUS COMMUNICATION OF NEEDS Skin   Extensive Assist Verbally Other (Comment)(deep tissue injury coccyx; MASD on sacrum- barrier cream applied)                        Personal Care Assistance Level of Assistance  Bathing, Feeding, Dressing Bathing Assistance: Maximum assistance Feeding assistance: Independent Dressing Assistance: Maximum assistance     Functional Limitations Info  Sight, Hearing, Speech Sight Info: Adequate Hearing Info: Adequate Speech Info: Adequate    SPECIAL CARE FACTORS FREQUENCY  PT (By licensed PT), OT (By licensed OT)     PT Frequency: 3x week OT Frequency: 2x week            Contractures Contractures Info: Not present    Additional Factors Info  Code Status, Allergies, Psychotropic Code Status Info: Full Code Allergies Info: CODEINE, LORTAB HYDROCODONE-ACETAMINOPHEN, MORPHINE AND RELATED  Psychotropic Info: risperdal 4mg  PO at bedtime; Zoloft 100mg  PO 2x day         Current Medications (12/04/2017):  This is the current hospital active medication list Current Facility-Administered Medications  Medication Dose Route Frequency Provider Last Rate Last Dose  . 0.9 %  sodium chloride infusion  250 mL Intravenous PRN Simonne MartinetBabcock, Peter E, NP      . 0.9 %  sodium chloride infusion   Intravenous Continuous Simonne MartinetBabcock, Peter E, NP 100 mL/hr at 12/04/17 1235    . albuterol (PROVENTIL) (2.5 MG/3ML) 0.083% nebulizer solution 2.5 mg  2.5 mg Nebulization BID PRN Simonne MartinetBabcock, Peter E, NP   2.5 mg at 12/03/17 0024  . benzonatate (TESSALON) capsule 100 mg  100 mg Oral TID PRN Garnette Gunnerhompson, Aaron B, MD      . Chlorhexidine Gluconate Cloth 2 % PADS 6  each  6 each Topical Q0600 Simonne Martinet, NP   6 each at 12/03/17 0840  . clonazePAM (KLONOPIN) tablet 1 mg  1 mg Oral BID PRN Garnette Gunner, MD      . enoxaparin (LOVENOX) injection 55 mg  55 mg Subcutaneous Q24H Simonne Martinet, NP   55 mg at 12/03/17 2318  . fluticasone (FLONASE) 50 MCG/ACT nasal spray 2 spray  2 spray Each Nare Daily Garnette Gunner, MD   2 spray at 12/04/17 1051  . iopamidol (ISOVUE-300) 61 % injection           . levothyroxine (SYNTHROID,  LEVOTHROID) tablet 75 mcg  75 mcg Oral QAC breakfast Simonne Martinet, NP   75 mcg at 12/04/17 1610  . loratadine (CLARITIN) tablet 10 mg  10 mg Oral QHS Garnette Gunner, MD   10 mg at 12/03/17 2321  . MEDLINE mouth rinse  15 mL Mouth Rinse BID Simonne Martinet, NP   15 mL at 12/04/17 1101  . meropenem (MERREM) 1 g in sodium chloride 0.9 % 100 mL IVPB  1 g Intravenous Q8H Millen, Jessica B, RPH 200 mL/hr at 12/04/17 1232 1 g at 12/04/17 1232  . mometasone-formoterol (DULERA) 200-5 MCG/ACT inhaler 2 puff  2 puff Inhalation BID Simonne Martinet, NP   2 puff at 12/04/17 0908  . montelukast (SINGULAIR) tablet 10 mg  10 mg Oral QHS Simonne Martinet, NP   10 mg at 12/03/17 2321  . multivitamin with minerals tablet 1 tablet  1 tablet Oral Daily Scatliffe, Gypsy Balsam, MD   1 tablet at 12/04/17 1100  . mupirocin ointment (BACTROBAN) 2 % 1 application  1 application Nasal BID Simonne Martinet, NP   1 application at 12/04/17 1100  . nystatin (MYCOSTATIN) 100000 UNIT/ML suspension 500,000 Units  5 mL Oral QID Simonne Martinet, NP   500,000 Units at 12/04/17 1101  . ondansetron (ZOFRAN) tablet 4 mg  4 mg Oral Q6H PRN Simonne Martinet, NP       Or  . ondansetron (ZOFRAN) injection 4 mg  4 mg Intravenous Q6H PRN Simonne Martinet, NP      . pantoprazole (PROTONIX) EC tablet 40 mg  40 mg Oral Daily Simonne Martinet, NP   40 mg at 12/04/17 1100  . potassium chloride SA (K-DUR,KLOR-CON) CR tablet 40 mEq  40 mEq Oral BID Diallo, Abdoulaye, MD      . pravastatin (PRAVACHOL) tablet 80 mg  80 mg Oral QHS Simonne Martinet, NP   80 mg at 12/03/17 2320  . predniSONE (DELTASONE) tablet 5 mg  5 mg Oral Q breakfast Diallo, Abdoulaye, MD      . risperiDONE (RISPERDAL) tablet 4 mg  4 mg Oral QHS Garnette Gunner, MD   4 mg at 12/03/17 2320  . roflumilast (DALIRESP) tablet 500 mcg  500 mcg Oral Daily Garnette Gunner, MD   500 mcg at 12/04/17 1100  . sertraline (ZOLOFT) tablet 100 mg  100 mg Oral BID Garnette Gunner, MD    100 mg at 12/04/17 1100  . [START ON 12/05/2017] Vitamin D3 CAPS 1 capsule  1 capsule Oral Q M,W,F Garnette Gunner, MD         Discharge Medications: Please see discharge summary for a list of discharge medications.  Relevant Imaging Results:  Relevant Lab Results:   Additional Information SS# [id number] [place], [place]

## 2017-12-04 NOTE — Social Work (Signed)
CSW aware that pt may be more SNF appropriate. CSW has completed FL2 for back up to CIR.   Doy HutchingIsabel H Salimah Bird, [facility] Clinical Social Work [phone]

## 2017-12-04 NOTE — Progress Notes (Signed)
Pharmacy Antibiotic Note  Julie Bird is a 52 y.o. female [facility] 12[facility]01/2017 [facility] Pharmacy consulted for Merrem. Ecoli is resistant to multiple organisms with a high MIC to Ceftriaxone.   WBC low at 3.6 (entire CBC down).  SCr 0.39 with normalized CrCl >100 ml/min  Plan: Discontinue Zosyn.  Merrem 1g IV every 8 hours.  Follow renal function, c/s, LOT  Height: 5\' 5"  (165.1 cm) Weight: 261 lb 7.5 oz (118.6 kg) IBW/kg (Calculated) : 57  Temp (24hrs), Avg:97.9 F (36.6 C), Min:97.6 F (36.4 C), Max:98.2 F (36.8 C)  Recent Labs  Lab 12/01/17 1132 12/01/17 1202 12/01/17 1526  12/02/17 0429  12/02/17 1335 12/02/17 1743 12/02/17 2010 12/03/17 1109 12/04/17 0547  WBC 9.5  --   --   --  12.7*  --   --   --   --  4.8 3.6*  CREATININE 0.73  --   --    < > 0.47   < > 0.46 0.47 0.47 0.48 0.39*  LATICACIDVEN  --  1.48 1.24  --   --   --   --   --   --   --   --    < > = values in this interval not displayed.    Estimated Creatinine Clearance: 108.4 mL/min (A) (by C-G formula based on SCr of 0.39 mg/dL (L)).    Allergies  Allergen Reactions  . Codeine Shortness Of Breath  . Lortab [Hydrocodone-Acetaminophen] Shortness Of Breath  . Morphine And Related Shortness Of Breath    Antimicrobials this admission: Zosyn 4/11 >>   Dose adjustments this admission: n/a  Microbiology results: 4/11 BCx:  4/11 urine: >100K Ecoli (R to mulitple agents; MIC 8 to Ceftriaxone) 4/11 Cdiff: negative 4/11 GI panel PCR: negative  Link SnufferJessica Izic Bird, PharmD, BCPS, BCCCP Clinical Pharmacist Clinical phone 12/04/2017 until 3:30PM - [phone] After hours, please call [phone] 12/04/2017 11:11 AM

## 2017-12-04 NOTE — Progress Notes (Signed)
Patient ID: Julie Bird, female   DOB: 05-10-1966, 52 y.o.   MRN: [id number] [facility] Progress Note:   * No surgery found *  Subjective: Mental status is alert and cooperative Objective: Vital signs in last 24 hours: Temp:  [97.6 F (36.4 C)-98.2 F (36.8 C)] 98.2 F (36.8 C) (04/14 0500) Pulse Rate:  [93-98] 93 (04/14 0500) Resp:  [17] 17 (04/14 0500) BP: (110-133)/(58-63) 114/61 (04/14 0500) SpO2:  [98 %-100 %] 98 % (04/14 0912) Weight:  [118.6 kg (261 lb 7.5 oz)] 118.6 kg (261 lb 7.5 oz) (04/14 0500)  Intake/Output from previous day: 04/13 0701 - 04/14 0700 In: 4800 [I.V.:4500; IV Piggyback:300] Out: -  Intake/Output this shift: No intake/output data recorded.  Physical Exam: Work of breathing is not labored;  Not complaining of abdominal pain;  Passing much flatus and had BM  Lab Results:  Results for orders placed or performed during the hospital encounter of 12/01/17 (from the past 48 hour(s))  Basic metabolic panel     Status: Abnormal   Collection Time: 12/02/17  1:35 PM  Result Value Ref Range   Sodium 132 (L) 135 - 145 mmol/L   Potassium 3.4 (L) 3.5 - 5.1 mmol/L   Chloride 98 (L) 101 - 111 mmol/L   CO2 23 22 - 32 mmol/L   Glucose, Bld 93 65 - 99 mg/dL   BUN 8 6 - 20 mg/dL   Creatinine, Ser 0.46 0.44 - 1.00 mg/dL   Calcium 7.6 (L) 8.9 - 10.3 mg/dL   GFR calc non Af Amer >60 >60 mL/min   GFR calc Af Amer >60 >60 mL/min    Comment: (NOTE) The eGFR has been calculated using the CKD EPI equation. This calculation has not been validated in all clinical situations. eGFR's persistently <60 mL/min signify possible Chronic Kidney Disease.    Anion gap 11 5 - 15    Comment: Performed at [facility] [address] [place], [place] [address]  Glucose, capillary     Status: None   Collection Time: 12/02/17  4:50 PM  Result Value Ref Range   Glucose-Capillary 97 65 - 99 mg/dL  Basic metabolic panel     Status: Abnormal   Collection Time:  12/02/17  5:43 PM  Result Value Ref Range   Sodium 132 (L) 135 - 145 mmol/L   Potassium 4.9 3.5 - 5.1 mmol/L    Comment: HEMOLYSIS AT THIS LEVEL MAY AFFECT RESULT   Chloride 102 101 - 111 mmol/L   CO2 19 (L) 22 - 32 mmol/L   Glucose, Bld 89 65 - 99 mg/dL   BUN 8 6 - 20 mg/dL   Creatinine, Ser 0.47 0.44 - 1.00 mg/dL   Calcium 7.5 (L) 8.9 - 10.3 mg/dL   GFR calc non Af Amer >60 >60 mL/min   GFR calc Af Amer >60 >60 mL/min    Comment: (NOTE) The eGFR has been calculated using the CKD EPI equation. This calculation has not been validated in all clinical situations. eGFR's persistently <60 mL/min signify possible Chronic Kidney Disease.    Anion gap 11 5 - 15    Comment: Performed at [facility] [address], [place], [place] [address]  Basic metabolic panel     Status: Abnormal   Collection Time: 12/02/17  8:10 PM  Result Value Ref Range   Sodium 132 (L) 135 - 145 mmol/L   Potassium 3.2 (L) 3.5 - 5.1 mmol/L   Chloride 99 (L) 101 -  111 mmol/L   CO2 23 22 - 32 mmol/L   Glucose, Bld 106 (H) 65 - 99 mg/dL   BUN 6 6 - 20 mg/dL   Creatinine, Ser 0.47 0.44 - 1.00 mg/dL   Calcium 7.6 (L) 8.9 - 10.3 mg/dL   GFR calc non Af Amer >60 >60 mL/min   GFR calc Af Amer >60 >60 mL/min    Comment: (NOTE) The eGFR has been calculated using the CKD EPI equation. This calculation has not been validated in all clinical situations. eGFR's persistently <60 mL/min signify possible Chronic Kidney Disease.    Anion gap 10 5 - 15    Comment: Performed at [facility] [address], [place], [place] [address]  Glucose, capillary     Status: None   Collection Time: 12/02/17 10:03 PM  Result Value Ref Range   Glucose-Capillary 96 65 - 99 mg/dL   Comment 1 Notify RN    Comment 2 Document in Chart   Procalcitonin     Status: None   Collection Time: 12/03/17  4:06 AM  Result Value Ref Range   Procalcitonin 0.28 ng/mL    Comment:        Interpretation: PCT (Procalcitonin) <= 0.5  ng/mL: Systemic infection (sepsis) is not likely. Local bacterial infection is possible. (NOTE)       Sepsis PCT Algorithm           Lower Respiratory Tract                                      Infection PCT Algorithm    ----------------------------     ----------------------------         PCT < 0.25 ng/mL                PCT < 0.10 ng/mL         Strongly encourage             Strongly discourage   discontinuation of antibiotics    initiation of antibiotics    ----------------------------     -----------------------------       PCT 0.25 - 0.50 ng/mL            PCT 0.10 - 0.25 ng/mL               OR       >80% decrease in PCT            Discourage initiation of                                            antibiotics      Encourage discontinuation           of antibiotics    ----------------------------     -----------------------------         PCT >= 0.50 ng/mL              PCT 0.26 - 0.50 ng/mL               AND        <80% decrease in PCT             Encourage initiation of  antibiotics       Encourage continuation           of antibiotics    ----------------------------     -----------------------------        PCT >= 0.50 ng/mL                  PCT > 0.50 ng/mL               AND         increase in PCT                  Strongly encourage                                      initiation of antibiotics    Strongly encourage escalation           of antibiotics                                     -----------------------------                                           PCT <= 0.25 ng/mL                                                 OR                                        > 80% decrease in PCT                                     Discontinue / Do not initiate                                             antibiotics Performed at [facility], [address] [place], [place] [address]   Glucose, capillary     Status: None    Collection Time: 12/03/17  7:42 AM  Result Value Ref Range   Glucose-Capillary 91 65 - 99 mg/dL  Basic metabolic panel     Status: Abnormal   Collection Time: 12/03/17 11:09 AM  Result Value Ref Range   Sodium 130 (L) 135 - 145 mmol/L   Potassium 3.3 (L) 3.5 - 5.1 mmol/L   Chloride 99 (L) 101 - 111 mmol/L   CO2 22 22 - 32 mmol/L   Glucose, Bld 90 65 - 99 mg/dL   BUN 5 (L) 6 - 20 mg/dL   Creatinine, Ser 0.48 0.44 - 1.00 mg/dL   Calcium 7.2 (L) 8.9 - 10.3 mg/dL   GFR calc non Af Amer >60 >60 mL/min   GFR calc Af Amer >60 >60 mL/min    Comment: (NOTE) The eGFR has been calculated using the CKD EPI equation. This  calculation has not been validated in all clinical situations. eGFR's persistently <60 mL/min signify possible Chronic Kidney Disease.    Anion gap 9 5 - 15    Comment: Performed at [facility] [address], [place], [place] [address]  CBC     Status: Abnormal   Collection Time: 12/03/17 11:09 AM  Result Value Ref Range   WBC 4.8 4.0 - 10.5 K/uL   RBC 2.88 (L) 3.87 - 5.11 MIL/uL   Hemoglobin 8.1 (L) 12.0 - 15.0 g/dL   HCT 25.5 (L) 36.0 - 46.0 %   MCV 88.5 78.0 - 100.0 fL   MCH 28.1 26.0 - 34.0 pg   MCHC 31.8 30.0 - 36.0 g/dL   RDW 14.3 11.5 - 15.5 %   Platelets 104 (L) 150 - 400 K/uL    Comment: REPEATED TO VERIFY SPECIMEN CHECKED FOR CLOTS PLATELET COUNT CONFIRMED BY SMEAR Performed at [facility], [place] [address] [place], [place] [address]   Magnesium     Status: Abnormal   Collection Time: 12/03/17 11:09 AM  Result Value Ref Range   Magnesium 1.6 (L) 1.7 - 2.4 mg/dL    Comment: Performed at [facility] [address] [place], [place] [address]  Glucose, capillary     Status: None   Collection Time: 12/03/17 12:24 PM  Result Value Ref Range   Glucose-Capillary 82 65 - 99 mg/dL  Glucose, capillary     Status: None   Collection Time: 12/03/17  5:11 PM  Result Value Ref Range   Glucose-Capillary 80 65 - 99 mg/dL  Glucose, capillary      Status: None   Collection Time: 12/03/17 10:08 PM  Result Value Ref Range   Glucose-Capillary 96 65 - 99 mg/dL   Comment 1 Notify RN    Comment 2 Document in Chart   CBC     Status: Abnormal   Collection Time: 12/04/17  5:47 AM  Result Value Ref Range   WBC 3.6 (L) 4.0 - 10.5 K/uL   RBC 2.64 (L) 3.87 - 5.11 MIL/uL   Hemoglobin 7.3 (L) 12.0 - 15.0 g/dL   HCT 23.5 (L) 36.0 - 46.0 %   MCV 89.0 78.0 - 100.0 fL   MCH 27.7 26.0 - 34.0 pg   MCHC 31.1 30.0 - 36.0 g/dL   RDW 14.3 11.5 - 15.5 %   Platelets 85 (L) 150 - 400 K/uL    Comment: CONSISTENT WITH PREVIOUS RESULT Performed at [facility], [place] [address], [place], [place] [address]   Basic metabolic panel     Status: Abnormal   Collection Time: 12/04/17  5:47 AM  Result Value Ref Range   Sodium 132 (L) 135 - 145 mmol/L   Potassium 2.9 (L) 3.5 - 5.1 mmol/L   Chloride 99 (L) 101 - 111 mmol/L   CO2 24 22 - 32 mmol/L   Glucose, Bld 87 65 - 99 mg/dL   BUN <5 (L) 6 - 20 mg/dL   Creatinine, Ser 0.39 (L) 0.44 - 1.00 mg/dL   Calcium 7.3 (L) 8.9 - 10.3 mg/dL   GFR calc non Af Amer >60 >60 mL/min   GFR calc Af Amer >60 >60 mL/min    Comment: (NOTE) The eGFR has been calculated using the CKD EPI equation. This calculation has not been validated in all clinical situations. eGFR's persistently <60 mL/min signify possible Chronic Kidney Disease.    Anion gap 9 5 - 15  Comment: Performed at [facility], [place] [address], [place], [place] [address]  Glucose, capillary     Status: Abnormal   Collection Time: 12/04/17  8:02 AM  Result Value Ref Range   Glucose-Capillary 102 (H) 65 - 99 mg/dL    Radiology/Results: No results found.  Anti-infectives: Anti-infectives (From admission, onward)   Start     Dose/Rate Route Frequency Ordered Stop   12/02/17 0400  piperacillin-tazobactam (ZOSYN) IVPB 3.375 g     3.375 g 12.5 mL/hr over 240 Minutes Intravenous Every 8 hours 12/01/17 2236     12/01/17 2000   piperacillin-tazobactam (ZOSYN) IVPB 3.375 g  Status:  Discontinued     3.375 g 12.5 mL/hr over 240 Minutes Intravenous Every 8 hours 12/01/17 1232 12/01/17 2208   12/01/17 1930  azithromycin (ZITHROMAX) 500 mg in sodium chloride 0.9 % 250 mL IVPB  Status:  Discontinued     500 mg 250 mL/hr over 60 Minutes Intravenous Every 24 hours 12/01/17 1919 12/01/17 2208   12/01/17 1230  piperacillin-tazobactam (ZOSYN) IVPB 3.375 g     3.375 g 100 mL/hr over 30 Minutes Intravenous  Once 12/01/17 1216 12/01/17 1324      Assessment/Plan: Problem List: Patient Active Problem List   Diagnosis Date Noted  . Pressure injury of skin 12/02/2017  . Hyponatremia 12/02/2017  . Colitis 12/02/2017  . History of diverticulitis 12/02/2017  . Hepatic steatosis 12/02/2017  . Hypomagnesemia 12/02/2017  . Hypoalbuminemia 12/02/2017  . Elevated procalcitonin 12/02/2017  . Leukocytosis 12/02/2017  . Thrombocytopenia ([place]) 12/02/2017  . Normocytic anemia 12/02/2017  . Pyuria 12/02/2017  . Protein-calorie malnutrition, severe 12/02/2017  . Hypotension 12/01/2017  . Dehydration   . Perforated diverticulum   . Pneumonia of right lower lobe due to infectious organism ([place])   . Failure to thrive in adult   . Diarrhea in adult patient   . Splenomegaly 09/28/2017    Taking clears at present.  Would advance slowly.  Suspect some underlying chronic illness at work here.   * No surgery found *    LOS: 3 days   Matt B. Hassell Done, MD, [facility] [phone] beeper [phone]  12/04/2017 10:15 AM

## 2017-12-05 DIAGNOSIS — K5732 Diverticulitis of large intestine without perforation or abscess without bleeding: Secondary | ICD-10-CM

## 2017-12-05 LAB — PATHOLOGIST SMEAR REVIEW

## 2017-12-05 LAB — CBC
HEMATOCRIT: 25.7 % — AB (ref 36.0–46.0)
Hemoglobin: 8.2 g/dL — ABNORMAL LOW (ref 12.0–15.0)
MCH: 27.8 pg (ref 26.0–34.0)
MCHC: 31.9 g/dL (ref 30.0–36.0)
MCV: 87.1 fL (ref 78.0–100.0)
Platelets: 84 10*3/uL — ABNORMAL LOW (ref 150–400)
RBC: 2.95 MIL/uL — ABNORMAL LOW (ref 3.87–5.11)
RDW: 14.5 % (ref 11.5–15.5)
WBC: 3.6 10*3/uL — ABNORMAL LOW (ref 4.0–10.5)

## 2017-12-05 LAB — COMPREHENSIVE METABOLIC PANEL
ALBUMIN: 2.2 g/dL — AB (ref 3.5–5.0)
ALK PHOS: 71 U/L (ref 38–126)
ALT: 12 U/L — ABNORMAL LOW (ref 14–54)
ANION GAP: 10 (ref 5–15)
AST: 19 U/L (ref 15–41)
BILIRUBIN TOTAL: 0.5 mg/dL (ref 0.3–1.2)
CALCIUM: 7.4 mg/dL — AB (ref 8.9–10.3)
CO2: 25 mmol/L (ref 22–32)
Chloride: 100 mmol/L — ABNORMAL LOW (ref 101–111)
Creatinine, Ser: 0.37 mg/dL — ABNORMAL LOW (ref 0.44–1.00)
GFR calc Af Amer: >60 mL/min (ref >60)
GLUCOSE: 93 mg/dL (ref 65–99)
Potassium: 2.8 mmol/L — ABNORMAL LOW (ref 3.5–5.1)
Sodium: 135 mmol/L (ref 135–145)
TOTAL PROTEIN: 4.8 g/dL — AB (ref 6.5–8.1)

## 2017-12-05 LAB — GLUCOSE, CAPILLARY
GLUCOSE-CAPILLARY: 90 mg/dL (ref 65–99)
GLUCOSE-CAPILLARY: 140 mg/dL — AB (ref 65–99)
GLUCOSE-CAPILLARY: 129 mg/dL — AB (ref 65–99)
Glucose-Capillary: 111 mg/dL — ABNORMAL HIGH (ref 65–99)

## 2017-12-05 LAB — RETICULOCYTES
RBC.: 2.63 MIL/uL — ABNORMAL LOW (ref 3.87–5.11)
Retic Count, Absolute: 31.6 10*3/uL (ref 19.0–186.0)
Retic Ct Pct: 1.2 % (ref 0.4–3.1)

## 2017-12-05 LAB — MAGNESIUM: Magnesium: 1.5 mg/dL — ABNORMAL LOW (ref 1.7–2.4)

## 2017-12-05 LAB — LACTATE DEHYDROGENASE: LDH: 103 U/L (ref 98–192)

## 2017-12-05 LAB — URIC ACID: URIC ACID, SERUM: 4.7 mg/dL (ref 2.3–6.6)

## 2017-12-05 LAB — HIV ANTIBODY (ROUTINE TESTING W REFLEX): HIV Screen 4th Generation wRfx: NONREACTIVE

## 2017-12-05 MED ORDER — HYDROCORTISONE NA SUCCINATE PF 100 MG IJ SOLR
100.0000 mg | Freq: Two times a day (BID) | INTRAMUSCULAR | Status: DC
Start: 2017-12-05 — End: 2017-12-06
  Administered 2017-12-05 – 2017-12-06 (×2): 100 mg via INTRAVENOUS
  Filled 2017-12-05 (×2): qty 2

## 2017-12-05 MED ORDER — CLONAZEPAM 1 MG PO TABS
1.0000 mg | ORAL_TABLET | Freq: Every day | ORAL | Status: DC | PRN
  Administered 2017-12-05: 1 mg via ORAL
  Filled 2017-12-05: qty 1

## 2017-12-05 MED ORDER — VITAMIN D (ERGOCALCIFEROL) 1.25 MG (50000 UNIT) PO CAPS
50000.0000 [IU] | ORAL_CAPSULE | ORAL | Status: DC
  Administered 2017-12-05 – 2017-12-09 (×3): 50000 [IU] via ORAL
  Filled 2017-12-05 (×3): qty 1

## 2017-12-05 MED ORDER — PREMIER PROTEIN SHAKE
11.0000 [oz_av] | Freq: Three times a day (TID) | ORAL | Status: DC
  Administered 2017-12-05 – 2017-12-08 (×10): 11 [oz_av] via ORAL
  Filled 2017-12-05 (×18): qty 325.31

## 2017-12-05 NOTE — Social Work (Signed)
CSW acknowledging consult for SNF. Pt and pt family would like pt to discharge home.   CSW signing off. Please consult if any additional needs arise.  Julie HutchingIsabel H Onell Mcmath, LCSWA [facility], [facility], [facility] Clinical Social Work [phone]

## 2017-12-05 NOTE — Progress Notes (Signed)
[facility] Progress Note     Subjective: CC:  Denies abdominal pain, nausea, or vomiting. Tolerating PO. Reports having 2-3 loose, but not watery, non-painful BMs daily. Reports previous bouts of diverticulitis treated with PO abx.  Objective: Vital signs in last 24 hours: Temp:  [97.6 F (36.4 C)-98.2 F (36.8 C)] 98.2 F (36.8 C) (04/15 0422) Pulse Rate:  [85-96] 85 (04/15 0422) Resp:  [17] 17 (04/14 2041) BP: (97-118)/(53-71) 97/53 (04/15 0422) SpO2:  [96 %-100 %] 96 % (04/15 0422) Last BM Date: 12/04/17  Intake/Output from previous day: 04/14 0701 - 04/15 0700 In: 2806.7 [P.O.:540; I.V.:1966.7; IV Piggyback:300] Out: -  Intake/Output this shift: No intake/output data recorded.  PE: Gen:  Alert, NAD, pleasant Card:  Regular rate and rhythm, pedal pulses 2+ BL Pulm:  Normal effort, clear to auscultation bilaterally with decreased breath sounds in bilateral bases Abd: Soft, obese, non-tender, non-distended, bowel sounds present in all 4 quadrants Skin: warm and dry, no rashes  Psych: A&Ox3   Lab Results:  Recent Labs    12/04/17 2037 12/05/17 0500  WBC 4.2 3.6*  HGB 7.6* 8.2*  HCT 24.1* 25.7*  PLT 82* 84*   BMET Recent Labs    12/03/17 1109 12/04/17 0547  NA 130* 132*  K 3.3* 2.9*  CL 99* 99*  CO2 22 24  GLUCOSE 90 87  BUN 5* <5*  CREATININE 0.48 0.39*  CALCIUM 7.2* 7.3*   PT/INR No results for input(s): LABPROT, INR in the last 72 hours. CMP     Component Value Date/Time   NA 132 (L) 12/04/2017 0547   K 2.9 (L) 12/04/2017 0547   CL 99 (L) 12/04/2017 0547   CO2 24 12/04/2017 0547   GLUCOSE 87 12/04/2017 0547   BUN <5 (L) 12/04/2017 0547   CREATININE 0.39 (L) 12/04/2017 0547   CALCIUM 7.3 (L) 12/04/2017 0547   PROT 5.6 (L) 12/02/2017 0429   ALBUMIN 2.5 (L) 12/02/2017 0429   AST 20 12/02/2017 0429   ALT 12 (L) 12/02/2017 0429   ALKPHOS 82 12/02/2017 0429   BILITOT 0.6 12/02/2017 0429   GFRNONAA >60 12/04/2017 0547   GFRAA >60  12/04/2017 0547   Lipase     Component Value Date/Time   LIPASE 91 (H) 12/01/2017 1132       Studies/Results: Ct Abdomen Pelvis W Contrast  Result Date: 12/04/2017 CLINICAL DATA:  Recent diverticulitis EXAM: CT ABDOMEN AND PELVIS WITH CONTRAST TECHNIQUE: Multidetector CT imaging of the abdomen and pelvis was performed using the standard protocol following bolus administration of intravenous contrast. Oral contrast was also administered. CONTRAST:  ISOVUE-300 IOPAMIDOL (ISOVUE-300) INJECTION 61% COMPARISON:  December 01, 2017 FINDINGS: Lower chest: There is infiltrate in the right middle lobe, not present on most recent study. There is also consolidation with pleural effusion in the right lower lobe, increased from recent study. There is a new small left pleural effusion with left base atelectasis. Hepatobiliary: Liver measures 22.4 cm in length. There is diffuse hepatic steatosis. No focal liver lesions are evident. Gallbladder is absent. There is no biliary duct dilatation. Pancreas: No pancreatic mass or inflammatory focus. Spleen: Spleen measures 18.9 x 13.4 x 9.2 cm with a measured splenic volume of 1,165 cubic cm. No focal splenic lesions are evident. Adrenals/Urinary Tract: Adrenals bilaterally appear normal. Kidneys bilaterally show no evident mass or hydronephrosis. No renal or ureteral calculus evident. Urinary bladder is midline with wall thickness within normal limits. Stomach/Bowel: There is extensive wall thickening throughout essentially the  entire right colon as well as portions of the proximal transverse colon. There is wall thickening throughout much of the descending colon and sigmoid colon as well. Note that there are irregular diverticula again noted in the distal descending and sigmoid colon regions. No well-defined abscess is seen. The area of perforation at the junction of the descending and sigmoid colon is again noted with oral contrast extending through the wall into this  area. No air is seen in this area. This area is no larger than on the previous study and may be some slightly smaller at this time. No small bowel lesions are evident. No small bowel obstruction. There is persistent mesenteric stranding and thickening in the right upper pelvis. There is slightly more ill-defined fluid in this area consistent with loculated ascites. No free air or portal venous air is evident. Vascular/Lymphatic: There is aortic and iliac artery atherosclerosis without evident aneurysm. Major mesenteric vessels appear patent. No adenopathy is appreciable in the abdomen or pelvis. Reproductive: Uterus is anteverted. No uterine or ovarian mass evident. Other: Appendix appears unremarkable. No abscess is evident in the abdomen or pelvis. There is a small ventral hernia containing only fat. There is edema throughout the posterior and lateral abdominal walls. A lesser degree of edema is noted in the anterior abdominal wall. A small amount of air is noted in the anterior abdominal wall, likely due to injection. Musculoskeletal: There is degenerative change in the lumbar spine. No blastic or lytic bone lesions noted. No intramuscular lesions are demonstrated. IMPRESSION: 1. Extensive changes of colitis involving the right colon and proximal transverse colon. Colitis with apparent superimposed diverticulitis throughout much of the left colon and sigmoid colon. Small perforation near the junction of the descending and sigmoid colon is again noted, marginally smaller. No abscess evident. There is an increase in inflammation in the right upper pelvis with mild loculated ascites in this area, a finding not present previously. No small bowel inflammation evident. 2. No well-defined abscess in the abdomen or pelvis. No bowel obstruction. Appendix region appears normal. 3. Areas of pneumonia in the right middle and lower lobes with increase in consolidation in the right lower lobe and new consolidation in portions  of the right middle lobe compared to recent prior study. There are pleural effusions bilaterally, fairly small but increased on the right and new on the left. 4.  Splenomegaly.  No focal splenic lesions. 5.  Hepatomegaly with diffuse hepatic steatosis. 6. Edema throughout portions of the abdominal and pelvic walls. Question a degree of underlying anasarca. 7.  Aortoiliac atherosclerosis. 8.  Small ventral hernia containing only fat. Aortic Atherosclerosis (ICD10-I70.0). Electronically Signed   By: Bretta BangWilliam  Woodruff III M.D.   On: 12/04/2017 18:25    Anti-infectives: Anti-infectives (From admission, onward)   Start     Dose/Rate Route Frequency Ordered Stop   12/04/17 1400  meropenem (MERREM) 1 g in sodium chloride 0.9 % 100 mL IVPB  Status:  Discontinued     1 g 200 mL/hr over 30 Minutes Intravenous Every 8 hours 12/04/17 1110 12/04/17 1114   12/04/17 1115  meropenem (MERREM) 1 g in sodium chloride 0.9 % 100 mL IVPB     1 g 200 mL/hr over 30 Minutes Intravenous Every 8 hours 12/04/17 1114     12/02/17 0400  piperacillin-tazobactam (ZOSYN) IVPB 3.375 g  Status:  Discontinued     3.375 g 12.5 mL/hr over 240 Minutes Intravenous Every 8 hours 12/01/17 2236 12/04/17 1047   12/01/17 2000  piperacillin-tazobactam (ZOSYN) IVPB 3.375 g  Status:  Discontinued     3.375 g 12.5 mL/hr over 240 Minutes Intravenous Every 8 hours 12/01/17 1232 12/01/17 2208   12/01/17 1930  azithromycin (ZITHROMAX) 500 mg in sodium chloride 0.9 % 250 mL IVPB  Status:  Discontinued     500 mg 250 mL/hr over 60 Minutes Intravenous Every 24 hours 12/01/17 1919 12/01/17 2208   12/01/17 1230  piperacillin-tazobactam (ZOSYN) IVPB 3.375 g     3.375 g 100 mL/hr over 30 Minutes Intravenous  Once 12/01/17 1216 12/01/17 1324     Assessment/Plan COPD/asthma E.coli UTI  RLL PNA  CHF  Hypothyroidism HLD Depression/anxiety   Pancolitis  - afebrile, VSS, no leukocytosis  - c.diff and GI panel negative, lactoferrin positive, O&P  pending - on full liquids without worsening sxs, advance as tolerated - GI following   FEN: IVFs, full liquids, advance as tolerated. VTE: SCD's, Lovenox  ID: [email or web address] 4/12 >>   Dispo: no acute surgical needs Will need outpatient GI follow up (per chart review pt wants to switch her care to a gastroenterologist in [place]) for colonoscopy in 6-8 weeks.    LOS: 4 days    Adam Phenix , [facility] Surgery 12/05/2017, 7:59 AM Pager: [phone] Consults: [phone] Mon-Fri 7:00 am-4:30 pm Sat-Sun 7:00 am-11:30 am

## 2017-12-05 NOTE — Plan of Care (Signed)

## 2017-12-05 NOTE — Progress Notes (Signed)
MD- Please note corrected path report.

## 2017-12-05 NOTE — Progress Notes (Signed)
Physical Therapy Treatment Patient Details Name: Julie Bird MRN: [id number] DOB: [birth year] Today's Date: 12/05/2017    History of Present Illness Pt is a 52 year old woman admitted 12/01/17 with FTT with chronic N/V/D and hypotension. Pt with colitis vs diverticulitis vs gastroparesis. PMH: anxiety, depression, COPD on 4L 02 at home, HTN, diverticulosis.    PT Comments    Pt progressing towards physical therapy goals. Was able to perform transfers with up to +2 mod assist for balance support and safety. Pt demonstrated decreased tolerance for functional activity but was eager to work with PT this morning. Overall, do not feel pt is able to tolerate the level of therapies required for a CIR admission. Noted that pt and family prefer return home at d/c with HHPT to follow up. Continue to feel that pt would benefit from skilled therapy, and recommending rehab at the SNF level to maximize functional independence and decrease burden of care on family at home. Will continue to follow.   Follow Up Recommendations  SNF;Supervision/Assistance - 24 hour     Equipment Recommendations  None recommended by PT    Recommendations for Other Services       Precautions / Restrictions Precautions Precautions: Fall Restrictions Weight Bearing Restrictions: No    Mobility  Bed Mobility Overal bed mobility: Needs Assistance Bed Mobility: Rolling;Sidelying to Sit Rolling: Min assist Sidelying to sit: Mod assist       General bed mobility comments: VC's for step-by-step sequencing for log roll. Heavy use of rails required. Bed pad used to assist pt with scooting, and assist from therapist to elevate trunk into full sitting position.   Transfers Overall transfer level: Needs assistance Equipment used: 2 person hand held assist Transfers: Sit to/from [employer] Sit to Stand: Mod assist;+2 physical assistance;From elevated surface   Squat pivot transfers: Mod assist;+2 physical  assistance;From elevated surface     General transfer comment: x2 attempts to achieve sit>stand. Pt unable to hold static standing or attempt to take steps around to the chair. A seated rest was taken and a squat pivot transfer was performed to get pt to chair.   Ambulation/Gait             General Gait Details: Unable - pt states she does not ambulate at baseline and is at a transfers only level to/from wheelchair.    Stairs             Wheelchair Mobility    Modified Rankin (Stroke Patients Only)       Balance Overall balance assessment: Needs assistance Sitting-balance support: Feet supported Sitting balance-Leahy Scale: Fair     Standing balance support: Bilateral upper extremity supported Standing balance-Leahy Scale: Poor                              Cognition Arousal/Alertness: Awake/alert Behavior During Therapy: WFL for tasks assessed/performed Overall Cognitive Status: Within Functional Limits for tasks assessed                                        Exercises General Exercises - Lower Extremity Quad Sets: 10 reps Long Arc Quad: 10 reps Hip ABduction/ADduction: 10 reps Heel Raises: 10 reps    General Comments        Pertinent Vitals/Pain Pain Assessment: Faces Faces Pain Scale: Hurts even more Pain Location: Generalized  pain with movement.  Pain Descriptors / Indicators: Grimacing;Guarding;Sore Pain Intervention(s): Monitored during session    Home Living                      Prior Function            PT Goals (current goals can now be found in the care plan section) Acute Rehab PT Goals Patient Stated Goal: to get stronger PT Goal Formulation: With patient Time For Goal Achievement: 12/16/17 Potential to Achieve Goals: Good Progress towards PT goals: Progressing toward goals    Frequency    Min 3X/week      PT Plan Discharge plan needs to be updated    Co-evaluation               AM-PAC PT "6 Clicks" Daily Activity  Outcome Measure  Difficulty turning over in bed (including adjusting bedclothes, sheets and blankets)?: Unable Difficulty moving from lying on back to sitting on the side of the bed? : Unable Difficulty sitting down on and standing up from a chair with arms (e.g., wheelchair, bedside commode, etc,.)?: Unable Help needed moving to and from a bed to chair (including a wheelchair)?: A Lot Help needed walking in hospital room?: Total Help needed climbing 3-5 steps with a railing? : Total 6 Click Score: 7    End of Session Equipment Utilized During Treatment: Gait belt;Oxygen Activity Tolerance: Patient limited by fatigue Patient left: in chair;with call bell/phone within reach;with family/visitor present Nurse Communication: Mobility status PT Visit Diagnosis: Other abnormalities of gait and mobility (R26.89);Muscle weakness (generalized) (M62.81)     Time: 1030-1053 PT Time Calculation (min) (ACUTE ONLY): 23 min  Charges:  $Gait Training: 23-37 mins                    G Codes:       Conni SlipperLaura Cassidey Barrales, PT, DPT Acute Rehabilitation Services Pager: [phone]    Marylynn PearsonLaura D Chayce Robbins 12/05/2017, 1:05 PM

## 2017-12-05 NOTE — Care Management Note (Signed)
Case Management Note  Patient Details  Name: Julie Bird MRN: [id number] Date of Birth: 24-Oct-1965  Subjective/Objective:                    Action/Plan:  Spoke to patient, husband and daughter at bedside. They do not want SNF. They would like to take her home with home health services. Offered list of home health agencies. Husband declined stated patient's PCP Dr Isaac BlissMcClure will set up home health services after discharge.   Explained NCM can assist in setting up home health , can fax clinical information and initial orders from hospital MD to begin. Husband in agreement and will call PCP office today to see which home health agency PCP prefers.   Patient has walker at home. Husband has ordered 3 in 1 and just has to pick it up.  Patient has home oxygen already and family has portable oxygen tank  And can transport patient home at discharge. Expected Discharge Date:                  Expected Discharge Plan:  Home w Home Health Services  In-House Referral:     Discharge planning Services  CM Consult  Post Acute Care Choice:  Home Health Choice offered to:  Patient, Spouse, Adult Children  DME Arranged:  N/A DME Agency:  NA  HH Arranged:    HH Agency:     Status of Service:  In process, will continue to follow  If discussed at Long Length of Stay Meetings, dates discussed:    Additional Comments:  Julie Bird, Julie Cavan Marie, RN 12/05/2017, 1:52 PM

## 2017-12-05 NOTE — Progress Notes (Signed)
Family Medicine Teaching Service Daily Progress Note Intern Pager: [phone]  Patient name: Julie Bird Medical record number: [id number] Date of birth: 03-17-1966 Age: 52 y.o. Gender: female  Primary Care Provider: Truddie Coco, FNP Consultants: CCM, Surgery, GI Code Status: Full  Pt Overview and Major Events to Date:  Julie Bird is a 52 y.o. female presenting with nausea, vomiting and abdominal pain x2 months. PMH is significant for T2 DM, HLD, COPD, hypothyroidism, hypertension, GERD, anxiety and depression.   4/11 - Transferred to CCM and started on Neo for hypotension 4/13 - Transferred to FPTS   Assessment and Plan:  Pan-colitis with microperforation on chronic abdominal pain and nausea/diarhea Abdominal pain has resolved. Pt tolerating clear liquid PO. CT abdomen on admission showed pan-colitis, also showed 1.5 cm microperforation associated with diverticultis. Surgery recommended non-operative management and continued abx at this time. Stool studies positive fecal lactoferin, negative FOBT, neg C.diff. WBC down trending  --Appreciate GI recommendations --Zosyn ( 4/11-4/15), broadenend to Meropenem 1 g q8 (4/15) in setting of resistant E coli UTI --Follow up on O&P --Continue full liquid diet  Pancytopenia WBC 3, Hg 8, Plt 80s, FOBT neg. HIV pending. Iron studies consistant w/ anemia of chronic disease. Normal uric acid, LDH, retic count. Likely in the setting of chronic malnutrition  Hypokalemia 2.9 this morning, likely secondary to multiple episodes of emesis in the settings of colitis. --Follow up on am BMP --Replete as needed --Give KDur 40 mEq x2  Right lower lobe pneumonia Noted abdominal CT, not visualized on CXR. Pt is SORA. Currently on Meropenem. Procalcitonin 0.28. --Contine to monitor respiratory status --Continue meropenem  UTI with Ecoli, sensitivities pending UA moderate leuks, neg nitrites. Urine culture pos for e coli . 100K colonies, resistant to  multiple antibiotic. Likely ESBL based on susceptibility profile. Zosyn discontinued. --Patient started on Meropenem 1 g  q8 --Awaiting final culture   HFpEF, diastolic dysfunction Due to increasing dyspnea on admission, TTE was obtained. Showed EF 55-60%, mild basal hypertrophy.  --Daily weights --strict I/Os  Hypotension Currently hemodynamically stable. BP 118/71 --Will continue to monitor  Severe Calorie malnutrition Likely malabsorptive from chronic diarrhea. Last Magnesium low 1.6  -will recheck Mg and replete as necessary -cont   COPD, stable On 4L [place] O2 which is her home requirement. Uses albuterol nebs prn, fluticasone, daliresp, claritin, singular --Continue lbuterol prn, fluticasone, Dulera, Singulair, roflumilast, claritin --Restarted prednisone 5 mg daily  Hypothyroidism --Continue syntrhoid  HLD --Continue statin  Depression/Anxiety Takes zoloft 100 mg bid, risperidone 4mg  qhs, klonopin 2 mg four times a day prn. --Continue zoloft, risperidone --Clonazepam 1 mg bid prn  Deconditioning Concern for deconditioning given body habitus, minimal activity since admission, ICU and multiple medical problems. Consult for CIR evaluation will be placed. --Follow up on CIR evaluation   FEN/GI: clear liquids PPx: LMWH  Disposition: Inpatient until improved PO intake, eval for CIR vs. SNF likely monday  Subjective:  Patient says she feels better. No acute complaints for her diets, understand she will need to be advance slowly.  Objective: Temp:  [97.6 F (36.4 C)-98.2 F (36.8 C)] 98.2 F (36.8 C) (04/15 0422) Pulse Rate:  [85-96] 85 (04/15 0422) Resp:  [17] 17 (04/14 2041) BP: (97-118)/(53-71) 97/53 (04/15 0422) SpO2:  [96 %-100 %] 96 % (04/15 0422)   Physical Exam: Gen: morbidly obese, NAD, resting comfortably CV: RRR with no murmurs appreciated Pulm: 4L O2 [place], NWOB,  GI: large central pannis, Soft, Nontender,  MSK: no edema, cyanosis, or clubbing  noted Skin: warm, dry Extremity: Left air edematous without any pitting Neuro: grossly normal, moves all extremities Psych: Normal affect and thought content  Laboratory: Recent Labs  Lab 12/03/17 1109 12/04/17 0547 12/04/17 2037  WBC 4.8 3.6* 4.2  HGB 8.1* 7.3* 7.6*  HCT 25.5* 23.5* 24.1*  PLT 104* 85* 82*   Recent Labs  Lab 12/01/17 1132  12/02/17 0429  12/02/17 2010 12/03/17 1109 12/04/17 0547  NA 126*   < > 130*   < > 132* 130* 132*  K 4.2   < > 4.2   < > 3.2* 3.3* 2.9*  CL 88*   < > 97*   < > 99* 99* 99*  CO2 24   < > 21*   < > 23 22 24   BUN 10   < > 8   < > 6 5* <5*  CREATININE 0.73   < > 0.47   < > 0.47 0.48 0.39*  CALCIUM 8.8*   < > 7.9*   < > 7.6* 7.2* 7.3*  PROT 6.3*  --  5.6*  --   --   --   --   BILITOT 0.7  --  0.6  --   --   --   --   ALKPHOS 100  --  82  --   --   --   --   ALT 12*  --  12*  --   --   --   --   AST 23  --  20  --   --   --   --   GLUCOSE 115*   < > 103*   < > 106* 90 87   < > = values in this interval not displayed.   Ct Abdomen Pelvis W Contrast  Result Date: 12/04/2017 CLINICAL DATA:  Recent diverticulitis EXAM: CT ABDOMEN AND PELVIS WITH CONTRAST TECHNIQUE: Multidetector CT imaging of the abdomen and pelvis was performed using the standard protocol following bolus administration of intravenous contrast. Oral contrast was also administered. CONTRAST:  ISOVUE-300 IOPAMIDOL (ISOVUE-300) INJECTION 61% COMPARISON:  December 01, 2017 FINDINGS: Lower chest: There is infiltrate in the right middle lobe, not present on most recent study. There is also consolidation with pleural effusion in the right lower lobe, increased from recent study. There is a new small left pleural effusion with left base atelectasis. Hepatobiliary: Liver measures 22.4 cm in length. There is diffuse hepatic steatosis. No focal liver lesions are evident. Gallbladder is absent. There is no biliary duct dilatation. Pancreas: No pancreatic mass or inflammatory focus. Spleen:  Spleen measures 18.9 x 13.4 x 9.2 cm with a measured splenic volume of 1,165 cubic cm. No focal splenic lesions are evident. Adrenals/Urinary Tract: Adrenals bilaterally appear normal. Kidneys bilaterally show no evident mass or hydronephrosis. No renal or ureteral calculus evident. Urinary bladder is midline with wall thickness within normal limits. Stomach/Bowel: There is extensive wall thickening throughout essentially the entire right colon as well as portions of the proximal transverse colon. There is wall thickening throughout much of the descending colon and sigmoid colon as well. Note that there are irregular diverticula again noted in the distal descending and sigmoid colon regions. No well-defined abscess is seen. The area of perforation at the junction of the descending and sigmoid colon is again noted with oral contrast extending through the wall into this area. No air is seen in this area. This area is no larger than on the previous study and may be some slightly  smaller at this time. No small bowel lesions are evident. No small bowel obstruction. There is persistent mesenteric stranding and thickening in the right upper pelvis. There is slightly more ill-defined fluid in this area consistent with loculated ascites. No free air or portal venous air is evident. Vascular/Lymphatic: There is aortic and iliac artery atherosclerosis without evident aneurysm. Major mesenteric vessels appear patent. No adenopathy is appreciable in the abdomen or pelvis. Reproductive: Uterus is anteverted. No uterine or ovarian mass evident. Other: Appendix appears unremarkable. No abscess is evident in the abdomen or pelvis. There is a small ventral hernia containing only fat. There is edema throughout the posterior and lateral abdominal walls. A lesser degree of edema is noted in the anterior abdominal wall. A small amount of air is noted in the anterior abdominal wall, likely due to injection. Musculoskeletal: There is  degenerative change in the lumbar spine. No blastic or lytic bone lesions noted. No intramuscular lesions are demonstrated. IMPRESSION: 1. Extensive changes of colitis involving the right colon and proximal transverse colon. Colitis with apparent superimposed diverticulitis throughout much of the left colon and sigmoid colon. Small perforation near the junction of the descending and sigmoid colon is again noted, marginally smaller. No abscess evident. There is an increase in inflammation in the right upper pelvis with mild loculated ascites in this area, a finding not present previously. No small bowel inflammation evident. 2. No well-defined abscess in the abdomen or pelvis. No bowel obstruction. Appendix region appears normal. 3. Areas of pneumonia in the right middle and lower lobes with increase in consolidation in the right lower lobe and new consolidation in portions of the right middle lobe compared to recent prior study. There are pleural effusions bilaterally, fairly small but increased on the right and new on the left. 4.  Splenomegaly.  No focal splenic lesions. 5.  Hepatomegaly with diffuse hepatic steatosis. 6. Edema throughout portions of the abdominal and pelvic walls. Question a degree of underlying anasarca. 7.  Aortoiliac atherosclerosis. 8.  Small ventral hernia containing only fat. Aortic Atherosclerosis (ICD10-I70.0). Electronically Signed   By: Bretta BangWilliam  Woodruff III M.D.   On: 12/04/2017 18:25   Ct Abdomen Pelvis W Contrast  Result Date: 12/01/2017 CLINICAL DATA:  Continued surveillance diverticulitis. Nausea, vomiting, and diarrhea for 4 months. EXAM: CT ABDOMEN AND PELVIS WITH CONTRAST TECHNIQUE: Multidetector CT imaging of the abdomen and pelvis was performed using the standard protocol following bolus administration of intravenous contrast. CONTRAST:  100mL ISOVUE-300 IOPAMIDOL (ISOVUE-300) INJECTION 61% COMPARISON:  09/28/2017. FINDINGS: Lower chest: RIGHT lower lobe infiltrate. This  is a new finding from February. This affects primarily the posterior basal segment of the RIGHT lower lobe. No significant effusion. LEFT lung clear. Hepatobiliary: Steatosis. No focal liver abnormality. Cholecystectomy. Pancreas: Unremarkable. No pancreatic ductal dilatation or surrounding inflammatory changes. Low Spleen: Splenomegaly.  No focal lesions. Adrenals/Urinary Tract: Adrenal glands are unremarkable. Kidneys are normal, without renal calculi, focal lesion, or hydronephrosis. Bladder is unremarkable. Stomach/Bowel: Mild changes of descending/sigmoid colon colitis, with bowel wall thickening and reduction in luminal caliber. Mild mesenteric edema. Small 1.5 cm microperforation is seen at the proximal sigmoid region, series 3, image 80, more characteristic of diverticulitis. The ascending and transverse colon demonstrate bowel wall edema, new/increased from priors, raising the question of diffuse colitis. Infectious and inflammatory causes of colitis should be considered. No bowel obstruction. No free air or significant free fluid. Vascular/Lymphatic: Aortic atherosclerosis without enlarged abdominal or pelvic lymph nodes. Reproductive: Uterus and bilateral adnexa are unremarkable.  Other: Small periumbilical paramedian hernia containing fat. Slight associated mesenteric edema. Musculoskeletal: No acute or significant osseous findings. IMPRESSION: Mild increase in diffuse bowel wall thickening affecting not only the LEFT, but RIGHT and transverse colonic regions raising the question of diffuse colitis. Infectious and inflammatory causes should be considered. In the proximal descending region, there is a new microperforation, 1.5 cm in diameter, more characteristic of diverticulitis. Diverticulosis with diverticulitis was demonstrated on the previous scan, but may be mildly increased. Splenomegaly.  Hepatic steatosis. RIGHT lower lobe pneumonia, new from February. Electronically Signed   By: Elsie Stain  M.D.   On: 12/01/2017 15:12   Dg Chest Port 1 View  Result Date: 12/01/2017 CLINICAL DATA:  Shortness of breath and cough EXAM: PORTABLE CHEST 1 VIEW COMPARISON:  09/28/2017 FINDINGS: The heart size and mediastinal contours are within normal limits. Both lungs are clear. The visualized skeletal structures are unremarkable. IMPRESSION: No active disease. Electronically Signed   By: Alcide Clever M.D.   On: 12/01/2017 12:34     Garnette Gunner, MD 12/05/2017, 7:03 AM PGY-2, [facility] FPTS Intern pager: [phone], text pages welcome

## 2017-12-05 NOTE — Progress Notes (Signed)
Initial Nutrition Assessment  DOCUMENTATION CODES:   Severe malnutrition in context of acute illness/injury  INTERVENTION:   -Continue MVI daily -Premier Protein TID, each supplement provides 160 kcals and 30 grams protein (chocolate flavor)  NUTRITION DIAGNOSIS:   Severe Malnutrition related to acute illness(N/V/D with pancolitis, diverticulitis with microperforation) as evidenced by energy intake < or equal to 50% for > or equal to 5 days, percent weight loss, moderate muscle depletion.  Ongoing  GOAL:   Patient will meet greater than or equal to 90% of their needs  Progressing  MONITOR:   PO intake, Supplement acceptance, Diet advancement, Labs, Weight trends  REASON FOR ASSESSMENT:   Consult Assessment of nutrition requirement/status  ASSESSMENT:   52 yo female admitted with N/V/D/abdominal discomfort for 2 months ago (but first started to notice symptoms 9 months ago), hypovolemic hyponatremia. Pt with septic shock/severe sepsis with pan colitis, possible diverticulitis with microperforation, secretory diarrea, possible gastroparesis. Pt with hx of DM, COPD, HLD, HTN, GERD  4/12- advanced to full liquid diet (did not tolerate clears well due to indigestion)  Spoke with pt, who was sitting in recliner chair at time of visit. She reports feeling well today and tolerating full liquid diet. She shares she has been consuming mostly grits, juices, coffee, and sodas. No meal completion data available; pt reports consuming a bowl of grits, coffee, and juice this morning.   Discussed with pt importance of good meal and supplement intake to promote healing. Reviewed available supplements on formulary; pt reports that she has tried Boost in the past, but did not like the flavor. She is amenable to Premier Protein if available in chocolate flavor.   Medications reviewed and include prednisone and vitamin D.  Labs reviewed: K: 2.8, Mg: 1.5, CBGS WDL.   Diet Order:  Diet full  liquid Room service appropriate? Yes; Fluid consistency: Thin  EDUCATION NEEDS:   Education needs have been addressed  Skin:  Skin Assessment: Skin Integrity Issues: Skin Integrity Issues:: DTI DTI: coccyx  Last BM:  12/04/17  Height:   Ht Readings from Last 1 Encounters:  12/02/17 5\' 5"  (1.651 m)    Weight:   Wt Readings from Last 1 Encounters:  12/04/17 261 lb 7.5 oz (118.6 kg)    Ideal Body Weight:  56.8 kg  BMI:  Body mass index is 43.51 kg/m.  Estimated Nutritional Needs:   Kcal:  2000-2200 kcals  Protein:  113-125 g  Fluid:  >/= 2 L    Leasa Kincannon A. Mayford KnifeWilliams, RD, LDN, CDE Pager: [phone] After hours Pager: [phone]

## 2017-12-05 NOTE — Progress Notes (Signed)
Occupational Therapy Treatment Patient Details Name: Julie Bird MRN: [id number] DOB: November 30, 1965 Today's Date: 12/05/2017    History of present illness Pt is a 52 year old woman admitted 12/01/17 with FTT with chronic N/V/D and hypotension. Pt with colitis vs diverticulitis vs gastroparesis. PMH: anxiety, depression, COPD on 4L 02 at home, HTN, diverticulosis.   OT comments  Pt supine in bed upon entering the room. Pt performed 2 sets of 10 AROM for B UE digits, wrist, and elbow in all planes. AAROM for shoulder elevation x 10 reps for B UEs. Pt performed 20 reps of B LE heel slides and ankle pumps with reports of fatigue. Pt very anxious with exercise and given cues for pursed lip breathing throughout session. Pt will continue to benefit from acute OT intervention.   Follow Up Recommendations  SNF    Equipment Recommendations  3 in 1 bedside commode    Recommendations for Other Services      Precautions / Restrictions Precautions Precautions: Fall Restrictions Weight Bearing Restrictions: No              ADL either performed or assessed with clinical judgement        Vision Baseline Vision/History: No visual deficits Patient Visual Report: No change from baseline            Cognition Arousal/Alertness: Awake/alert Behavior During Therapy: WFL for tasks assessed/performed Overall Cognitive Status: Within Functional Limits for tasks assessed                     Pertinent Vitals/ Pain       Pain Assessment: Faces Faces Pain Scale: Hurts even more Pain Location: L shoulder AAROM Pain Descriptors / Indicators: Grimacing;Guarding;Sore Pain Intervention(s): Monitored during session;Repositioned         Frequency  Min 2X/week        Progress Toward Goals  OT Goals(current goals can now be found in the care plan section)  Progress towards OT goals: Progressing toward goals  Acute Rehab OT Goals Patient Stated Goal: to get stronger OT Goal  Formulation: With patient Time For Goal Achievement: 12/19/17 Potential to Achieve Goals: Fair  Plan         AM-PAC PT "6 Clicks" Daily Activity     Outcome Measure   Help from another person eating meals?: A Little Help from another person taking care of personal grooming?: A Little Help from another person toileting, which includes using toliet, bedpan, or urinal?: Total Help from another person bathing (including washing, rinsing, drying)?: A Lot Help from another person to put on and taking off regular upper body clothing?: A Lot Help from another person to put on and taking off regular lower body clothing?: Total 6 Click Score: 12    End of Session Equipment Utilized During Treatment: Oxygen(2 L)  OT Visit Diagnosis: Unsteadiness on feet (R26.81);Pain;Muscle weakness (generalized) (M62.81) Pain - Right/Left: Left Pain - part of body: Shoulder   Activity Tolerance Patient tolerated treatment well   Patient Left in bed;with call bell/phone within reach;with bed alarm set;with family/visitor present   Nurse Communication          Time: [id number] OT Time Calculation (min): 19 min  Charges: OT Treatments $Therapeutic Exercise: 8-22 mins     Jackquline DenmarkBradsher, Aqeel Norgaard P 12/05/2017, 8:50 AM

## 2017-12-05 NOTE — Progress Notes (Signed)
[facility] Progress Note   Chief Complaint:   Colitis on CT scan / diarrhea   SUBJECTIVE:    no further diarrhea, no significant abdominal pain. Tolerating PO   ASSESSMENT AND PLAN:   1. 52 yo female with diarrhea and right colon / prox transverse colitis with apparent superimposed left sided diverticulitis with small perforation near junction of descending and sigmoid colon (repeat scan done yesterday). No associated abscess. O+P pending, other stool studies negative except lactoferrin which isn't surprising given colonic inflammation. Of note, she had a colonoscopy in [place] a few months back -On Merrem. Zosyn was stopped when she became pancytopenic. Some improvement in counts since stopping Zosyn -Surgery is following -tolerating diet now.  -probably going to outpatient rehab for PT -Before she is discharged I will get her a follow up with [facility]. We will get records from Dr. Glade Lloyd.   2. Splenomegaly / steatosis. Platelets low. Wonder about early cirrhosis. She has a GI in [place], Dr. Glade Lloyd but wants to transition GI care to [place].   3. E.coli UTI and RLL/ RML PNA. On Merrem   OBJECTIVE:     Vital signs in last 24 hours: Temp:  [97.6 F (36.4 C)-98.2 F (36.8 C)] 98.2 F (36.8 C) (04/15 0422) Pulse Rate:  [85-96] 85 (04/15 0422) Resp:  [17] 17 (04/14 2041) BP: (97-118)/(53-71) 97/53 (04/15 0422) SpO2:  [96 %-100 %] 96 % (04/15 0422) Last BM Date: 12/04/17 General:   Alert, obese white female in bedside chair in NAD EENT:  Normal hearing, non icteric sclera, conjunctive pink.  Heart:  Regular rate and rhythm; no murmurs.  Pulm: Normal respiratory effort Abdomen:  Soft, obese, nontender.  Normal bowel sounds Neurologic:  Alert and  oriented x4;  grossly normal neurologically. Psych:  Pleasant, cooperative.  Normal mood and affect.   Intake/Output from previous day: 04/14 0701 - 04/15 0700 In: 2806.7 [P.O.:540; I.V.:1966.7; IV  Piggyback:300] Out: -  Intake/Output this shift: No intake/output data recorded.  Lab Results: Recent Labs    12/04/17 0547 12/04/17 2037 12/05/17 0500  WBC 3.6* 4.2 3.6*  HGB 7.3* 7.6* 8.2*  HCT 23.5* 24.1* 25.7*  PLT 85* 82* 84*   BMET Recent Labs    12/03/17 1109 12/04/17 0547 12/05/17 0600  NA 130* 132* 135  K 3.3* 2.9* 2.8*  CL 99* 99* 100*  CO2 22 24 25   GLUCOSE 90 87 93  BUN 5* <5* <5*  CREATININE 0.48 0.39* 0.37*  CALCIUM 7.2* 7.3* 7.4*   LFT Recent Labs    12/05/17 0600  PROT 4.8*  ALBUMIN 2.2*  AST 19  ALT 12*  ALKPHOS 71  BILITOT 0.5     Ct Abdomen Pelvis W Contrast  Result Date: 12/04/2017 CLINICAL DATA:  Recent diverticulitis EXAM: CT ABDOMEN AND PELVIS WITH CONTRAST TECHNIQUE: Multidetector CT imaging of the abdomen and pelvis was performed using the standard protocol following bolus administration of intravenous contrast. Oral contrast was also administered. CONTRAST:  ISOVUE-300 IOPAMIDOL (ISOVUE-300) INJECTION 61% COMPARISON:  December 01, 2017 FINDINGS: Lower chest: There is infiltrate in the right middle lobe, not present on most recent study. There is also consolidation with pleural effusion in the right lower lobe, increased from recent study. There is a new small left pleural effusion with left base atelectasis. Hepatobiliary: Liver measures 22.4 cm in length. There is diffuse hepatic steatosis. No focal liver lesions are evident. Gallbladder is absent. There is no biliary duct dilatation. Pancreas: No pancreatic mass  or inflammatory focus. Spleen: Spleen measures 18.9 x 13.4 x 9.2 cm with a measured splenic volume of 1,165 cubic cm. No focal splenic lesions are evident. Adrenals/Urinary Tract: Adrenals bilaterally appear normal. Kidneys bilaterally show no evident mass or hydronephrosis. No renal or ureteral calculus evident. Urinary bladder is midline with wall thickness within normal limits. Stomach/Bowel: There is extensive wall thickening  throughout essentially the entire right colon as well as portions of the proximal transverse colon. There is wall thickening throughout much of the descending colon and sigmoid colon as well. Note that there are irregular diverticula again noted in the distal descending and sigmoid colon regions. No well-defined abscess is seen. The area of perforation at the junction of the descending and sigmoid colon is again noted with oral contrast extending through the wall into this area. No air is seen in this area. This area is no larger than on the previous study and may be some slightly smaller at this time. No small bowel lesions are evident. No small bowel obstruction. There is persistent mesenteric stranding and thickening in the right upper pelvis. There is slightly more ill-defined fluid in this area consistent with loculated ascites. No free air or portal venous air is evident. Vascular/Lymphatic: There is aortic and iliac artery atherosclerosis without evident aneurysm. Major mesenteric vessels appear patent. No adenopathy is appreciable in the abdomen or pelvis. Reproductive: Uterus is anteverted. No uterine or ovarian mass evident. Other: Appendix appears unremarkable. No abscess is evident in the abdomen or pelvis. There is a small ventral hernia containing only fat. There is edema throughout the posterior and lateral abdominal walls. A lesser degree of edema is noted in the anterior abdominal wall. A small amount of air is noted in the anterior abdominal wall, likely due to injection. Musculoskeletal: There is degenerative change in the lumbar spine. No blastic or lytic bone lesions noted. No intramuscular lesions are demonstrated. IMPRESSION: 1. Extensive changes of colitis involving the right colon and proximal transverse colon. Colitis with apparent superimposed diverticulitis throughout much of the left colon and sigmoid colon. Small perforation near the junction of the descending and sigmoid colon is again  noted, marginally smaller. No abscess evident. There is an increase in inflammation in the right upper pelvis with mild loculated ascites in this area, a finding not present previously. No small bowel inflammation evident. 2. No well-defined abscess in the abdomen or pelvis. No bowel obstruction. Appendix region appears normal. 3. Areas of pneumonia in the right middle and lower lobes with increase in consolidation in the right lower lobe and new consolidation in portions of the right middle lobe compared to recent prior study. There are pleural effusions bilaterally, fairly small but increased on the right and new on the left. 4.  Splenomegaly.  No focal splenic lesions. 5.  Hepatomegaly with diffuse hepatic steatosis. 6. Edema throughout portions of the abdominal and pelvic walls. Question a degree of underlying anasarca. 7.  Aortoiliac atherosclerosis. 8.  Small ventral hernia containing only fat. Aortic Atherosclerosis (ICD10-I70.0). Electronically Signed   By: Bretta Bang III M.D.   On: 12/04/2017 18:25    Principal Problem:   Diverticulitis of colon with perforation Active Problems:   Hypotension   Dehydration   Perforated diverticulum   Pneumonia of right lower lobe due to infectious organism (HCC)   Diarrhea in adult patient   Pressure injury of skin   Hyponatremia   Colitis   Splenomegaly   History of diverticulitis   Hepatic steatosis  Hypomagnesemia   Hypoalbuminemia   Elevated procalcitonin   Leukocytosis   Thrombocytopenia (HCC)   Normocytic anemia   Pyuria   Protein-calorie malnutrition, severe   Acute lower UTI   Anxiety and depression   Benign essential HTN   Chronic obstructive pulmonary disease (HCC)   Diastolic dysfunction   Hypokalemia   Hypothyroidism   Pancytopenia (HCC)   Supplemental oxygen dependent     LOS: 4 days   Willette ClusterPaula Darrious Youman ,NP 12/05/2017, 11:03 AM  Pager number [phone]

## 2017-12-05 NOTE — Progress Notes (Signed)
Rehab admissions - Met with the patient and her granddaughter at the bedside.  Family prefers home with [facility] rather than SNF.  They were assisting patient at home prior to admission.  Rehab MD recommending possible need for SNF.  Patient has medicaid of [place].  It is unlikely that we could get approval for inpatient rehab admission.  Likely option will be for home with [facility] once medically stable.  Call me for questions.  #[phone]

## 2017-12-06 ENCOUNTER — Encounter: Payer: Self-pay | Admitting: Gastroenterology

## 2017-12-06 DIAGNOSIS — K529 Noninfective gastroenteritis and colitis, unspecified: Secondary | ICD-10-CM

## 2017-12-06 DIAGNOSIS — T451X5A Adverse effect of antineoplastic and immunosuppressive drugs, initial encounter: Secondary | ICD-10-CM

## 2017-12-06 DIAGNOSIS — D61810 Antineoplastic chemotherapy induced pancytopenia: Secondary | ICD-10-CM

## 2017-12-06 LAB — CULTURE, BLOOD (ROUTINE X 2)
CULTURE: NO GROWTH
CULTURE: NO GROWTH
SPECIAL REQUESTS: ADEQUATE
Special Requests: ADEQUATE

## 2017-12-06 LAB — BASIC METABOLIC PANEL
Anion gap: 11 (ref 5–15)
CO2: 26 mmol/L (ref 22–32)
CREATININE: 0.46 mg/dL (ref 0.44–1.00)
Calcium: 8.2 mg/dL — ABNORMAL LOW (ref 8.9–10.3)
Chloride: 98 mmol/L — ABNORMAL LOW (ref 101–111)
GFR calc non Af Amer: >60 mL/min (ref >60)
Glucose, Bld: 136 mg/dL — ABNORMAL HIGH (ref 65–99)
Potassium: 2.9 mmol/L — ABNORMAL LOW (ref 3.5–5.1)
Sodium: 135 mmol/L (ref 135–145)

## 2017-12-06 LAB — CBC
HEMATOCRIT: 25.4 % — AB (ref 36.0–46.0)
Hemoglobin: 7.9 g/dL — ABNORMAL LOW (ref 12.0–15.0)
MCH: 27.1 pg (ref 26.0–34.0)
MCHC: 31.1 g/dL (ref 30.0–36.0)
MCV: 87 fL (ref 78.0–100.0)
Platelets: 100 10*3/uL — ABNORMAL LOW (ref 150–400)
RBC: 2.92 MIL/uL — ABNORMAL LOW (ref 3.87–5.11)
RDW: 14.1 % (ref 11.5–15.5)
WBC: 4.3 10*3/uL (ref 4.0–10.5)

## 2017-12-06 LAB — GLUCOSE, CAPILLARY
GLUCOSE-CAPILLARY: 109 mg/dL — AB (ref 65–99)
Glucose-Capillary: 96 mg/dL (ref 65–99)
Glucose-Capillary: 136 mg/dL — ABNORMAL HIGH (ref 65–99)
Glucose-Capillary: 200 mg/dL — ABNORMAL HIGH (ref 65–99)

## 2017-12-06 LAB — OCCULT BLOOD X 1 CARD TO LAB, STOOL: FECAL OCCULT BLD: NEGATIVE

## 2017-12-06 MED ORDER — PREDNISONE 10 MG PO TABS: 10.0000 mg | ORAL_TABLET | Freq: Every day | ORAL | Status: DC

## 2017-12-06 MED ORDER — PREDNISONE 20 MG PO TABS: 20.0000 mg | ORAL_TABLET | Freq: Every day | ORAL | Status: DC

## 2017-12-06 MED ORDER — PREDNISONE 20 MG PO TABS
40.0000 mg | ORAL_TABLET | Freq: Every day | ORAL | Status: DC
  Administered 2017-12-06 – 2017-12-09 (×4): 40 mg via ORAL
  Filled 2017-12-06 (×4): qty 2

## 2017-12-06 MED ORDER — PREDNISONE 20 MG PO TABS: 30.0000 mg | ORAL_TABLET | Freq: Every day | ORAL | Status: DC

## 2017-12-06 MED ORDER — CLONAZEPAM 1 MG PO TABS
2.0000 mg | ORAL_TABLET | Freq: Once | ORAL | Status: AC
  Administered 2017-12-06: 2 mg via ORAL
  Filled 2017-12-06: qty 2

## 2017-12-06 MED ORDER — POTASSIUM CHLORIDE CRYS ER 20 MEQ PO TBCR
40.0000 meq | EXTENDED_RELEASE_TABLET | Freq: Two times a day (BID) | ORAL | Status: AC
  Administered 2017-12-06 (×2): 40 meq via ORAL
  Filled 2017-12-06 (×2): qty 2

## 2017-12-06 MED ORDER — MAGNESIUM SULFATE 4 GM/100ML IV SOLN
4.0000 g | Freq: Once | INTRAVENOUS | Status: AC
  Administered 2017-12-06: 4 g via INTRAVENOUS
  Filled 2017-12-06: qty 100

## 2017-12-06 NOTE — Progress Notes (Signed)
Pt continues to feel better. Having 1 or 2 daily, soft BMs and denies abdominal pain. Tolerating PO. Recent hematologic findings noted. GI following with plans for repeat colonoscopy at some point.   This patient has no acute surgical needs. General surgery will sign off and will be available as needed. Call with questions or concerns.   Hosie SpangleElizabeth Davie Sagona, PA-C [facility] Pager: [phone] Consults: [phone] Mon-Fri 7:00 am-4:30 pm Sat-Sun 7:00 am-11:30 am

## 2017-12-06 NOTE — Care Management Note (Signed)
Case Management Note  Patient Details  Name: Tamela Gammonracy Camarena MRN: [id number] Date of Birth: 1965/09/07  Subjective/Objective:                   Spoke to patient again today . States her husband has arranged home health through PCP. See note from yesterday. Explained again NCM can fax initial orders and hospital stay information directly to home health agency. Patient does not know name of agency , she will ask husband. Continue to follow. Action/Plan:   Expected Discharge Date:                  Expected Discharge Plan:  Home w Home Health Services  In-House Referral:     Discharge planning Services  CM Consult  Post Acute Care Choice:  Home Health Choice offered to:  Patient, Spouse, Adult Children  DME Arranged:  N/A DME Agency:  NA  HH Arranged:    HH Agency:     Status of Service:  In process, will continue to follow  If discussed at Long Length of Stay Meetings, dates discussed:    Additional Comments:  Kingsley PlanWile, Jaeden [place]bay Marie, RN 12/06/2017, 12:34 PM

## 2017-12-06 NOTE — Progress Notes (Signed)
Family Medicine Teaching Service Daily Progress Note Intern Pager: [phone]  Patient name: Julie Bird Medical record number: [id number] Date of birth: [birth year] Age: 52 y.o. Gender: female  Primary Care Provider: Guy Begin, FNP Consultants: CCM, Surgery, GI Code Status: Full  Pt Overview and Major Events to Date:  Julie Bird is a 53 y.o. female presenting with nausea, vomiting and abdominal pain x2 months. PMH is significant for T2 DM, HLD, COPD, hypothyroidism, hypertension, GERD, anxiety and depression.   4/11 - Transferred to CCM and started on Neo for hypotension 4/13 - Transferred to FPTS   Assessment and Plan:  Pan-colitis with microperforation on chronic abdominal pain and nausea/diarhea Abdominal pain has resolved. Pt tolerating soft PO. CT abdomen on admission showed pan-colitis, also showed 1.5 cm microperforation associated with diverticultis. Surgery recommended non-operative management and continued abx at this time. Stool studies positive fecal lactoferin, negative FOBT, neg C.diff. WBC down trending  --Appreciate GI recommendations --Zosyn ( 4/11-4/15), broadenend to Meropenem 1 g q8 (4/15) in setting of resistant E coli UTI, appreciate recs on end date of abx --Continue soft diet, ADAT  Pancytopenia WBC 3, Hg 8, Plt 80s, FOBT neg. HIV pending. Iron studies consistant w/ anemia of chronic disease. Normal uric acid, LDH, retic count. Likely in the setting of chronic malnutrition -HIV, rpr neg -smear consistant w/ reactive dz, pancytopenia -consider bone marrow biopsy outpatient  Hypokalemia 2.8 yesterday. Repeat pending.  --Follow up on am BMP --Replete as needed  Right lower lobe pneumonia Noted abdominal CT, not visualized on CXR. Pt is SORA. Currently on Meropenem. Procalcitonin 0.28. --Contine to monitor respiratory status --Continue meropenem  UTI with Ecoli, sensitivities pending UA moderate leuks, neg nitrites. Urine culture pos for e coli .  100K colonies, resistant to multiple antibiotic.  -cont Meropenem 1 g  q8  HFpEF, diastolic dysfunction Due to increasing dyspnea on admission, TTE was obtained. Showed EF 55-60%, mild basal hypertrophy.  --Daily weights --strict I/Os  Hypotension Currently hemodynamically stable, improved on stress dose steroids. Will initiate po steroid taper. Prednisone 40 and taker 10 per week until back to home does 5 mg --Will continue to monitor bp  Severe Calorie malnutrition Likely malabsorptive from chronic diarrhea. Last Magnesium low 1.6  -will recheck Mg and replete as necessary -cont   COPD, stable On 2L [place] O2 which isbelow her home requirement of 4L. Uses albuterol nebs prn, fluticasone, daliresp, claritin, singular --Continue lbuterol prn, fluticasone, Dulera, Singulair, roflumilast, claritin -oral prednisone 40 mg (taper as above)  Hypothyroidism --Continue syntrhoid  HLD --Continue statin  Depression/Anxiety Takes zoloft 100 mg bid, risperidone 28m qhs, klonopin 2 mg four times a day prn. --Continue zoloft, risperidone --Clonazepam 1 mg bid prn  Deconditioning Concern for deconditioning given body habitus, minimal activity since admission, ICU and multiple medical problems. Consult for CIR evaluation will be placed. --PT/OT recommend SNF, pt requesting to go home w/ HHPT OT   FEN/GI: home PPx: LMWH  Disposition: Inpatient, likely home tomorrow w/ HHPT/OT  Subjective:  Patient says she feels better. Able to tolerate soft diet well. Says having 1-2 soft bm, w/o diarrhea. No nausea  Objective: Temp:  [98.2 F (36.8 C)-98.8 F (37.1 C)] 98.8 F (37.1 C) (04/16 0500) Pulse Rate:  [97-108] 97 (04/16 0500) Resp:  [16-18] 18 (04/16 0500) BP: (122-124)/(60-73) 122/60 (04/16 0500) SpO2:  [95 %-97 %] 97 % (04/16 0842) Weight:  [263 lb 8 oz (119.5 kg)-266 lb 12.1 oz (121 kg)] 263 lb 8 oz (119.5 kg) (04/16 0500)  Physical Exam: Gen: morbidly obese, NAD, resting  comfortably CV: RRR with no murmurs appreciated Pulm: 2L O2 [place], NWOB, crackles right lower lobe GI: large central pannis, Soft, Nontender,  MSK: no edema, cyanosis, or clubbing noted Skin: warm, dry Extremity: Left upper extremity mild edematous without any pitting Neuro: grossly normal, moves all extremities Psych: Normal affect and thought content  Laboratory: Recent Labs  Lab 12/04/17 0547 12/04/17 2037 12/05/17 0500  WBC 3.6* 4.2 3.6*  HGB 7.3* 7.6* 8.2*  HCT 23.5* 24.1* 25.7*  PLT 85* 82* 84*   Recent Labs  Lab 12/01/17 1132  12/02/17 0429  12/03/17 1109 12/04/17 0547 12/05/17 0600  NA 126*   < > 130*   < > 130* 132* 135  K 4.2   < > 4.2   < > 3.3* 2.9* 2.8*  CL 88*   < > 97*   < > 99* 99* 100*  CO2 24   < > 21*   < > 22 24 25   BUN 10   < > 8   < > 5* <5* <5*  CREATININE 0.73   < > 0.47   < > 0.48 0.39* 0.37*  CALCIUM 8.8*   < > 7.9*   < > 7.2* 7.3* 7.4*  PROT 6.3*  --  5.6*  --   --   --  4.8*  BILITOT 0.7  --  0.6  --   --   --  0.5  ALKPHOS 100  --  82  --   --   --  71  ALT 12*  --  12*  --   --   --  12*  AST 23  --  20  --   --   --  19  GLUCOSE 115*   < > 103*   < > 90 87 93   < > = values in this interval not displayed.   Ct Abdomen Pelvis W Contrast  Result Date: 12/04/2017 CLINICAL DATA:  Recent diverticulitis EXAM: CT ABDOMEN AND PELVIS WITH CONTRAST TECHNIQUE: Multidetector CT imaging of the abdomen and pelvis was performed using the standard protocol following bolus administration of intravenous contrast. Oral contrast was also administered. CONTRAST:  156m ISOVUE-300 IOPAMIDOL (ISOVUE-300) INJECTION 61% COMPARISON:  December 01, 2017 FINDINGS: Lower chest: There is infiltrate in the right middle lobe, not present on most recent study. There is also consolidation with pleural effusion in the right lower lobe, increased from recent study. There is a new small left pleural effusion with left base atelectasis. Hepatobiliary: Liver measures 22.4 cm in length.  There is diffuse hepatic steatosis. No focal liver lesions are evident. Gallbladder is absent. There is no biliary duct dilatation. Pancreas: No pancreatic mass or inflammatory focus. Spleen: Spleen measures 18.9 x 13.4 x 9.2 cm with a measured splenic volume of 1,165 cubic cm. No focal splenic lesions are evident. Adrenals/Urinary Tract: Adrenals bilaterally appear normal. Kidneys bilaterally show no evident mass or hydronephrosis. No renal or ureteral calculus evident. Urinary bladder is midline with wall thickness within normal limits. Stomach/Bowel: There is extensive wall thickening throughout essentially the entire right colon as well as portions of the proximal transverse colon. There is wall thickening throughout much of the descending colon and sigmoid colon as well. Note that there are irregular diverticula again noted in the distal descending and sigmoid colon regions. No well-defined abscess is seen. The area of perforation at the junction of the descending and sigmoid colon is again noted with oral contrast  extending through the wall into this area. No air is seen in this area. This area is no larger than on the previous study and may be some slightly smaller at this time. No small bowel lesions are evident. No small bowel obstruction. There is persistent mesenteric stranding and thickening in the right upper pelvis. There is slightly more ill-defined fluid in this area consistent with loculated ascites. No free air or portal venous air is evident. Vascular/Lymphatic: There is aortic and iliac artery atherosclerosis without evident aneurysm. Major mesenteric vessels appear patent. No adenopathy is appreciable in the abdomen or pelvis. Reproductive: Uterus is anteverted. No uterine or ovarian mass evident. Other: Appendix appears unremarkable. No abscess is evident in the abdomen or pelvis. There is a small ventral hernia containing only fat. There is edema throughout the posterior and lateral abdominal  walls. A lesser degree of edema is noted in the anterior abdominal wall. A small amount of air is noted in the anterior abdominal wall, likely due to injection. Musculoskeletal: There is degenerative change in the lumbar spine. No blastic or lytic bone lesions noted. No intramuscular lesions are demonstrated. IMPRESSION: 1. Extensive changes of colitis involving the right colon and proximal transverse colon. Colitis with apparent superimposed diverticulitis throughout much of the left colon and sigmoid colon. Small perforation near the junction of the descending and sigmoid colon is again noted, marginally smaller. No abscess evident. There is an increase in inflammation in the right upper pelvis with mild loculated ascites in this area, a finding not present previously. No small bowel inflammation evident. 2. No well-defined abscess in the abdomen or pelvis. No bowel obstruction. Appendix region appears normal. 3. Areas of pneumonia in the right middle and lower lobes with increase in consolidation in the right lower lobe and new consolidation in portions of the right middle lobe compared to recent prior study. There are pleural effusions bilaterally, fairly small but increased on the right and new on the left. 4.  Splenomegaly.  No focal splenic lesions. 5.  Hepatomegaly with diffuse hepatic steatosis. 6. Edema throughout portions of the abdominal and pelvic walls. Question a degree of underlying anasarca. 7.  Aortoiliac atherosclerosis. 8.  Small ventral hernia containing only fat. Aortic Atherosclerosis (ICD10-I70.0). Electronically Signed   By: Lowella Grip III M.D.   On: 12/04/2017 18:25   Ct Abdomen Pelvis W Contrast  Result Date: 12/01/2017 CLINICAL DATA:  Continued surveillance diverticulitis. Nausea, vomiting, and diarrhea for 4 months. EXAM: CT ABDOMEN AND PELVIS WITH CONTRAST TECHNIQUE: Multidetector CT imaging of the abdomen and pelvis was performed using the standard protocol following bolus  administration of intravenous contrast. CONTRAST:  151m ISOVUE-300 IOPAMIDOL (ISOVUE-300) INJECTION 61% COMPARISON:  09/28/2017. FINDINGS: Lower chest: RIGHT lower lobe infiltrate. This is a new finding from February. This affects primarily the posterior basal segment of the RIGHT lower lobe. No significant effusion. LEFT lung clear. Hepatobiliary: Steatosis. No focal liver abnormality. Cholecystectomy. Pancreas: Unremarkable. No pancreatic ductal dilatation or surrounding inflammatory changes. Low Spleen: Splenomegaly.  No focal lesions. Adrenals/Urinary Tract: Adrenal glands are unremarkable. Kidneys are normal, without renal calculi, focal lesion, or hydronephrosis. Bladder is unremarkable. Stomach/Bowel: Mild changes of descending/sigmoid colon colitis, with bowel wall thickening and reduction in luminal caliber. Mild mesenteric edema. Small 1.5 cm microperforation is seen at the proximal sigmoid region, series 3, image 80, more characteristic of diverticulitis. The ascending and transverse colon demonstrate bowel wall edema, new/increased from priors, raising the question of diffuse colitis. Infectious and inflammatory causes of colitis should  be considered. No bowel obstruction. No free air or significant free fluid. Vascular/Lymphatic: Aortic atherosclerosis without enlarged abdominal or pelvic lymph nodes. Reproductive: Uterus and bilateral adnexa are unremarkable. Other: Small periumbilical paramedian hernia containing fat. Slight associated mesenteric edema. Musculoskeletal: No acute or significant osseous findings. IMPRESSION: Mild increase in diffuse bowel wall thickening affecting not only the LEFT, but RIGHT and transverse colonic regions raising the question of diffuse colitis. Infectious and inflammatory causes should be considered. In the proximal descending region, there is a new microperforation, 1.5 cm in diameter, more characteristic of diverticulitis. Diverticulosis with diverticulitis was  demonstrated on the previous scan, but may be mildly increased. Splenomegaly.  Hepatic steatosis. RIGHT lower lobe pneumonia, new from February. Electronically Signed   By: Staci Righter M.D.   On: 12/01/2017 15:12   Dg Chest Port 1 View  Result Date: 12/01/2017 CLINICAL DATA:  Shortness of breath and cough EXAM: PORTABLE CHEST 1 VIEW COMPARISON:  09/28/2017 FINDINGS: The heart size and mediastinal contours are within normal limits. Both lungs are clear. The visualized skeletal structures are unremarkable. IMPRESSION: No active disease. Electronically Signed   By: Inez Catalina M.D.   On: 12/01/2017 12:34     Bonnita Hollow, MD 12/06/2017, 9:56 AM PGY-1, [place] [facility] pager: [phone], text pages welcome

## 2017-12-06 NOTE — Progress Notes (Signed)
Daily Rounding Note  12/06/2017, 8:46 AM  LOS: 5 days   SUBJECTIVE:      Stools are soft, formed.  2 yesterday.    OBJECTIVE:         Vital signs in last 24 hours:    Temp:  [98.2 F (36.8 C)-98.8 F (37.1 C)] 98.8 F (37.1 C) (04/16 0500) Pulse Rate:  [97-108] 97 (04/16 0500) Resp:  [16-18] 18 (04/16 0500) BP: (122-124)/(60-73) 122/60 (04/16 0500) SpO2:  [95 %-97 %] 97 % (04/16 0842) Weight:  [263 lb 8 oz (119.5 kg)-266 lb 12.1 oz (121 kg)] 263 lb 8 oz (119.5 kg) (04/16 0500) Last BM Date: 12/05/17 Filed Weights   12/04/17 0500 12/05/17 2045 12/06/17 0500  Weight: 261 lb 7.5 oz (118.6 kg) 266 lb 12.1 oz (121 kg) 263 lb 8 oz (119.5 kg)   General: looks chronically ill and unwell.  Looks old for age.  Alert.  Comfortable.  Cushingoid.     Heart: RRR Chest: ronchi in upper airway.  Some rales in right base.  Slight dyspnea with speaking Abdomen: obese, soft, NT.  Active BS  Extremities: + LE edema.   Neuro/Psych:  Oriented x 3.  Moves all 4 limbs  Intake/Output from previous day: 04/15 0701 - 04/16 0700 In: 1058 [P.O.:220; I.V.:838] Out: -   Intake/Output this shift: No intake/output data recorded.  Lab Results: Recent Labs    12/04/17 0547 12/04/17 2037 12/05/17 0500  WBC 3.6* 4.2 3.6*  HGB 7.3* 7.6* 8.2*  HCT 23.5* 24.1* 25.7*  PLT 85* 82* 84*   BMET Recent Labs    12/03/17 1109 12/04/17 0547 12/05/17 0600  NA 130* 132* 135  K 3.3* 2.9* 2.8*  CL 99* 99* 100*  CO2 22 24 25   GLUCOSE 90 87 93  BUN 5* <5* <5*  CREATININE 0.48 0.39* 0.37*  CALCIUM 7.2* 7.3* 7.4*   LFT Recent Labs    12/05/17 0600  PROT 4.8*  ALBUMIN 2.2*  AST 19  ALT 12*  ALKPHOS 71  BILITOT 0.5   PT/INR No results for input(s): LABPROT, INR in the last 72 hours. Hepatitis Panel No results for input(s): HEPBSAG, HCVAB, HEPAIGM, HEPBIGM in the last 72 hours.  Studies/Results: Ct Abdomen Pelvis W  Contrast  Result Date: 12/04/2017 CLINICAL DATA:  Recent diverticulitis EXAM: CT ABDOMEN AND PELVIS WITH CONTRAST TECHNIQUE: Multidetector CT imaging of the abdomen and pelvis was performed using the standard protocol following bolus administration of intravenous contrast. Oral contrast was also administered. CONTRAST:  ISOVUE-300 IOPAMIDOL (ISOVUE-300) INJECTION 61% COMPARISON:  December 01, 2017 FINDINGS: Lower chest: There is infiltrate in the right middle lobe, not present on most recent study. There is also consolidation with pleural effusion in the right lower lobe, increased from recent study. There is a new small left pleural effusion with left base atelectasis. Hepatobiliary: Liver measures 22.4 cm in length. There is diffuse hepatic steatosis. No focal liver lesions are evident. Gallbladder is absent. There is no biliary duct dilatation. Pancreas: No pancreatic mass or inflammatory focus. Spleen: Spleen measures 18.9 x 13.4 x 9.2 cm with a measured splenic volume of 1,165 cubic cm. No focal splenic lesions are evident. Adrenals/Urinary Tract: Adrenals bilaterally appear normal. Kidneys bilaterally show no evident mass or hydronephrosis. No renal or ureteral calculus evident. Urinary bladder is midline with wall thickness within normal limits. Stomach/Bowel: There is extensive wall thickening throughout essentially the entire right colon as well as portions of  the proximal transverse colon. There is wall thickening throughout much of the descending colon and sigmoid colon as well. Note that there are irregular diverticula again noted in the distal descending and sigmoid colon regions. No well-defined abscess is seen. The area of perforation at the junction of the descending and sigmoid colon is again noted with oral contrast extending through the wall into this area. No air is seen in this area. This area is no larger than on the previous study and may be some slightly smaller at this time. No small  bowel lesions are evident. No small bowel obstruction. There is persistent mesenteric stranding and thickening in the right upper pelvis. There is slightly more ill-defined fluid in this area consistent with loculated ascites. No free air or portal venous air is evident. Vascular/Lymphatic: There is aortic and iliac artery atherosclerosis without evident aneurysm. Major mesenteric vessels appear patent. No adenopathy is appreciable in the abdomen or pelvis. Reproductive: Uterus is anteverted. No uterine or ovarian mass evident. Other: Appendix appears unremarkable. No abscess is evident in the abdomen or pelvis. There is a small ventral hernia containing only fat. There is edema throughout the posterior and lateral abdominal walls. A lesser degree of edema is noted in the anterior abdominal wall. A small amount of air is noted in the anterior abdominal wall, likely due to injection. Musculoskeletal: There is degenerative change in the lumbar spine. No blastic or lytic bone lesions noted. No intramuscular lesions are demonstrated. IMPRESSION: 1. Extensive changes of colitis involving the right colon and proximal transverse colon. Colitis with apparent superimposed diverticulitis throughout much of the left colon and sigmoid colon. Small perforation near the junction of the descending and sigmoid colon is again noted, marginally smaller. No abscess evident. There is an increase in inflammation in the right upper pelvis with mild loculated ascites in this area, a finding not present previously. No small bowel inflammation evident. 2. No well-defined abscess in the abdomen or pelvis. No bowel obstruction. Appendix region appears normal. 3. Areas of pneumonia in the right middle and lower lobes with increase in consolidation in the right lower lobe and new consolidation in portions of the right middle lobe compared to recent prior study. There are pleural effusions bilaterally, fairly small but increased on the right and  new on the left. 4.  Splenomegaly.  No focal splenic lesions. 5.  Hepatomegaly with diffuse hepatic steatosis. 6. Edema throughout portions of the abdominal and pelvic walls. Question a degree of underlying anasarca. 7.  Aortoiliac atherosclerosis. 8.  Small ventral hernia containing only fat. Aortic Atherosclerosis (ICD10-I70.0). Electronically Signed   By: Bretta Bang III M.D.   On: 12/04/2017 18:25   Scheduled Meds: . Chlorhexidine Gluconate Cloth  6 each Topical Q0600  . enoxaparin (LOVENOX) injection  55 mg Subcutaneous Q24H  . fluticasone  2 spray Each Nare Daily  . hydrocortisone sod succinate (SOLU-CORTEF) inj  100 mg Intravenous Q12H  . levothyroxine  75 mcg Oral QAC breakfast  . loratadine  10 mg Oral QHS  . mouth rinse  15 mL Mouth Rinse BID  . mometasone-formoterol  2 puff Inhalation BID  . montelukast  10 mg Oral QHS  . multivitamin with minerals  1 tablet Oral Daily  . mupirocin ointment  1 application Nasal BID  . nystatin  5 mL Oral QID  . pantoprazole  40 mg Oral Daily  . pravastatin  80 mg Oral QHS  . protein supplement shake  11 oz Oral TID BM  .  risperidone  4 mg Oral QHS  . roflumilast  500 mcg Oral Daily  . sertraline  100 mg Oral BID  . Vitamin D (Ergocalciferol)  50,000 Units Oral Q M,W,F   Continuous Infusions: . sodium chloride    . sodium chloride 50 mL/hr at 12/06/17 0336  . meropenem (MERREM) IV Stopped (12/06/17 0517)   PRN Meds:.sodium chloride, albuterol, benzonatate, clonazePAM, ondansetron **OR** ondansetron (ZOFRAN) IV  ASSESMENT:   *   Transverse colitis and superimposed diverticulitis with left sided microperf of diverticula.  Intermittent diarrhea for last few months.  Reported 120# weight loss.  Sigmoid diverticulitis on CT in 09/28/17.   Previous colonoscopy with polypectomies fall 2018  Stool c diff negative.  Lactoferrin +.   O & P in process.    Diarrhea improved.     *  Hepatic steatosis, splenomegaly, diffuse anasarca.  With low  platelets, concern for cirrhosis. LFTs low.  Albumin low c/w malnutrition.       *  Hypokalemia.  Persists.    *   Normocytic anemia.    *  E coli UTI, CAP.  Day 3 meropenem, day 2 zithromax.   Has chronic 4 liter O2 requiring COPD.    *   Morbid obesity.  Though reports 120# weight loss/302months as she quit eating.  *  Grade 1 diastolic dysfunction, EF 55 to 60%.     PLAN   *  Has GI ROV set up with Dr Chales AbrahamsGupta on May 17 at 10:30 AM.    *  For treatment of diverticulitis/microperf would complete 14 days of total abx.      Jennye MoccasinSarah Dartagnan Beavers  12/06/2017, 8:46 AM Phone [phone]

## 2017-12-07 DIAGNOSIS — Z1624 Resistance to multiple antibiotics: Secondary | ICD-10-CM

## 2017-12-07 DIAGNOSIS — E43 Unspecified severe protein-calorie malnutrition: Secondary | ICD-10-CM

## 2017-12-07 DIAGNOSIS — J189 Pneumonia, unspecified organism: Secondary | ICD-10-CM

## 2017-12-07 DIAGNOSIS — B9620 Unspecified Escherichia coli [E. coli] as the cause of diseases classified elsewhere: Secondary | ICD-10-CM

## 2017-12-07 DIAGNOSIS — Z836 Family history of other diseases of the respiratory system: Secondary | ICD-10-CM

## 2017-12-07 DIAGNOSIS — Z87891 Personal history of nicotine dependence: Secondary | ICD-10-CM

## 2017-12-07 DIAGNOSIS — Z885 Allergy status to narcotic agent status: Secondary | ICD-10-CM

## 2017-12-07 DIAGNOSIS — E8809 Other disorders of plasma-protein metabolism, not elsewhere classified: Secondary | ICD-10-CM

## 2017-12-07 LAB — CBC
HCT: 24.6 % — ABNORMAL LOW (ref 36.0–46.0)
Hemoglobin: 7.7 g/dL — ABNORMAL LOW (ref 12.0–15.0)
MCH: 27.3 pg (ref 26.0–34.0)
MCHC: 31.3 g/dL (ref 30.0–36.0)
MCV: 87.2 fL (ref 78.0–100.0)
PLATELETS: 114 10*3/uL — AB (ref 150–400)
RBC: 2.82 MIL/uL — ABNORMAL LOW (ref 3.87–5.11)
RDW: 14.3 % (ref 11.5–15.5)
WBC: 3.8 10*3/uL — AB (ref 4.0–10.5)

## 2017-12-07 LAB — BASIC METABOLIC PANEL
Anion gap: 9 (ref 5–15)
Anion gap: 10 (ref 5–15)
CALCIUM: 8.1 mg/dL — AB (ref 8.9–10.3)
CO2: 27 mmol/L (ref 22–32)
CO2: 25 mmol/L (ref 22–32)
CREATININE: 0.4 mg/dL — AB (ref 0.44–1.00)
CREATININE: 0.47 mg/dL (ref 0.44–1.00)
Calcium: 8.3 mg/dL — ABNORMAL LOW (ref 8.9–10.3)
Chloride: 100 mmol/L — ABNORMAL LOW (ref 101–111)
Chloride: 101 mmol/L (ref 101–111)
GFR calc Af Amer: >60 mL/min (ref >60)
GFR calc non Af Amer: >60 mL/min (ref >60)
Glucose, Bld: 95 mg/dL (ref 65–99)
Glucose, Bld: 172 mg/dL — ABNORMAL HIGH (ref 65–99)
Potassium: 3 mmol/L — ABNORMAL LOW (ref 3.5–5.1)
Potassium: 3.9 mmol/L (ref 3.5–5.1)
SODIUM: 136 mmol/L (ref 135–145)
Sodium: 136 mmol/L (ref 135–145)

## 2017-12-07 LAB — GLUCOSE, CAPILLARY
Glucose-Capillary: 113 mg/dL — ABNORMAL HIGH (ref 65–99)
Glucose-Capillary: 131 mg/dL — ABNORMAL HIGH (ref 65–99)
Glucose-Capillary: 141 mg/dL — ABNORMAL HIGH (ref 65–99)
Glucose-Capillary: 99 mg/dL (ref 65–99)

## 2017-12-07 LAB — MAGNESIUM: Magnesium: 1.8 mg/dL (ref 1.7–2.4)

## 2017-12-07 LAB — OVA + PARASITE EXAM

## 2017-12-07 LAB — O&P RESULT

## 2017-12-07 MED ORDER — CLONAZEPAM 1 MG PO TABS: 2.0000 mg | ORAL_TABLET | Freq: Every day | ORAL | Status: DC | PRN

## 2017-12-07 MED ORDER — POTASSIUM CHLORIDE 10 MEQ/100ML IV SOLN
10.0000 meq | INTRAVENOUS | Status: DC
  Administered 2017-12-07 (×4): 10 meq via INTRAVENOUS
  Filled 2017-12-07 (×2): qty 100

## 2017-12-07 MED ORDER — POTASSIUM CHLORIDE CRYS ER 20 MEQ PO TBCR
40.0000 meq | EXTENDED_RELEASE_TABLET | Freq: Once | ORAL | Status: AC
  Administered 2017-12-07: 40 meq via ORAL
  Filled 2017-12-07: qty 2

## 2017-12-07 MED ORDER — MAGNESIUM SULFATE 2 GM/50ML IV SOLN
2.0000 g | Freq: Once | INTRAVENOUS | Status: AC
Start: 2017-12-07 — End: 2017-12-07
  Administered 2017-12-07: 2 g via INTRAVENOUS
  Filled 2017-12-07: qty 50

## 2017-12-07 NOTE — Care Management Note (Signed)
Case Management Note  Patient Details  Name: Julie Bird MRN: [id number] Date of Birth: [birth year]  Subjective/Objective:                    Action/Plan: Spoke to patient again regarding home health . Provided another list of home health agencies for her zip code. Patient still undecided on which agency she wants. Patient states she is discharging tomorrow and will decide at that time. Again explained NCM will have to call /fax referral to agency and be sure agency is in network with insurance, covers her address and has staff. Patient voiced understanding. Grand daughter at bedside asleep.    Expected Discharge Date:                  Expected Discharge Plan:  Home w Home Health Services  In-House Referral:     Discharge planning Services  CM Consult  Post Acute Care Choice:  Home Health Choice offered to:  Patient, Spouse, Adult Children  DME Arranged:  N/A DME Agency:  NA  HH Arranged:    HH Agency:     Status of Service:  In process, will continue to follow  If discussed at Long Length of Stay Meetings, dates discussed:    Additional Comments:  Kingsley PlanWile, [name], RN 12/07/2017, 3:21 PM

## 2017-12-07 NOTE — Consult Note (Signed)
[facility]    Date of Admission:  12/01/2017     Total days of antibiotics  6  Day 4 meropenem              Reason for Consult: MDRO E coli UTI, diverticulitis, pneumonia   Referring Provider: Dr. Janee Morn  Primary Care Provider: Truddie Coco, FNP   Assessment: 52 y.o. female with severe COPD (on chronic home O2 @4  lpm) admitted with abdominal pain and diarrhea 6 days ago. CT scan revealed diverticulitis / colitis and microperforation of bowel. Repeat CT scan with same findings. Surgery has seen her and recommended medical management considering she was improving after course of IV antibiotics and no abscess formation on repeat scan. She has had considerable symptom improvement and has received 6 days of IV therapy; I believe she is stable enough to finish course of therapy with oral levaqun 500 mg qday + metronidazole 500 mg TID even considering her microperforation. Would recommend 8 more days of treatment.  Regarding her urine isolate - it seems she had some urinary tract symptoms that are improving with 3 days of IV meropenem and would consider this to be adequate with stopping her carbapenem on d/c. She has minimal sx and would query if her dx is actually asymptomatic bacteriuria (than true UTI).  Regarding her possible pneumonia symptoms/cough - she certainly has risk for developing pneumonia with her severe COPD and hospitalization; if it is felt she needs coverage for this would consider levofloxacin 500 mg QD in addition to metronidazole will adequately cover this. She is at her baseline with regards to her sx (despite her CXR).     Plan: 1. 8 days of PO antibiotics at d/c, would use levaquin + metronidazole  available as needed.   Principal Problem:   Diverticulitis of colon with perforation Active Problems:   Hypotension   Dehydration   Perforated diverticulum   Pneumonia of right lower lobe due to infectious organism (HCC)   Diarrhea  in adult patient   Pressure injury of skin   Hyponatremia   Colitis   Splenomegaly   History of diverticulitis   Hepatic steatosis   Hypomagnesemia   Hypoalbuminemia   Elevated procalcitonin   Leukocytosis   Thrombocytopenia (HCC)   Normocytic anemia   Pyuria   Protein-calorie malnutrition, severe   Acute lower UTI   Anxiety and depression   Benign essential HTN   Chronic obstructive pulmonary disease (HCC)   Diastolic dysfunction   Hypokalemia   Hypothyroidism   Pancytopenia (HCC)   Supplemental oxygen dependent   Noninfectious gastroenteritis   Antineoplastic chemotherapy induced pancytopenia (HCC)   . enoxaparin (LOVENOX) injection  55 mg Subcutaneous Q24H  . fluticasone  2 spray Each Nare Daily  . levothyroxine  75 mcg Oral QAC breakfast  . loratadine  10 mg Oral QHS  . mouth rinse  15 mL Mouth Rinse BID  . mometasone-formoterol  2 puff Inhalation BID  . montelukast  10 mg Oral QHS  . multivitamin with minerals  1 tablet Oral Daily  . nystatin  5 mL Oral QID  . pantoprazole  40 mg Oral Daily  . pravastatin  80 mg Oral QHS  . [START ON 12/20/2017] predniSONE  20 mg Oral Q breakfast   Followed by  . [START ON 12/27/2017] predniSONE  10 mg Oral Q breakfast  . predniSONE  40 mg Oral Q breakfast   Followed by  . [START ON  12/13/2017] predniSONE  30 mg Oral Q breakfast  . protein supplement shake  11 oz Oral TID BM  . risperidone  4 mg Oral QHS  . roflumilast  500 mcg Oral Daily  . sertraline  100 mg Oral BID  . Vitamin D (Ergocalciferol)  50,000 Units Oral Q M,W,F    HPI: Julie Bird is a 52 y.o. female with medical history detailed below but significant for acute diverticulitis/colitis with microperforation on CT scan. She was admitted 6 days ago and has been on IV antibiotics since that time. Also noted was a new right lower lobe infiltrate concerning for pneumonia. Prior to admission she and her husband report diarrhea and abdominal pain that has been ongoing for  several months with escalating symptom severity. She never experienced any fevers during course of illness leading up to or during current hospitalization. During work up urine culture with e coli 100k colonies - she does report that she had some difficulty starting urine stream prior to admission and that this has improved with antibiotics. She has had no further episodes of diarrhea and in fact reports normal BM. She does report that she has noticed a developing cough and more "thick" in quality than normal but not overtly productive yet. She is ready to go home and both she and her husband are wondering if she can leave today.   Review of Systems: Review of Systems  Constitutional: Negative for chills, fever and malaise/fatigue.  Respiratory: Positive for cough. Negative for sputum production.   Cardiovascular: Positive for chest pain.  Genitourinary: Positive for dysuria.  Please see HPI. All other systems reviewed and negative.   Past Medical History:  Diagnosis Date  . Asthma   . COPD (chronic obstructive pulmonary disease) (HCC)   . Diabetes mellitus without complication (HCC)   . Diverticulitis   . Hypertension     Social History   Tobacco Use  . Smoking status: Former Games developermoker  . Smokeless tobacco: Never Used  Substance Use Topics  . Alcohol use: No    Frequency: Never  . Drug use: No    Family History  Problem Relation Age of Onset  . Diabetes Mother   . Hypertension Mother   . Alzheimer's disease Mother   . COPD Mother   . Emphysema Father   . Alzheimer's disease Father    Allergies  Allergen Reactions  . Codeine Shortness Of Breath  . Lortab [Hydrocodone-Acetaminophen] Shortness Of Breath  . Morphine And Related Shortness Of Breath    OBJECTIVE: Blood pressure 136/84, pulse (!) 103, temperature 98.1 F (36.7 C), temperature source Oral, resp. rate 17, height 5\' 5"  (1.651 m), weight 264 lb 12.4 oz (120.1 kg), SpO2 92 %.  Physical Exam  Constitutional: No  distress.  HENT:  Mouth/Throat: No oropharyngeal exudate.  Eyes: Pupils are equal, round, and reactive to light. EOM are normal.  Neck: Normal range of motion. Neck supple.  Cardiovascular: Normal rate, regular rhythm and normal heart sounds.  Pulmonary/Chest: Effort normal. She has rhonchi.  Abdominal: Soft. Bowel sounds are normal. She exhibits no distension. There is no tenderness.  Musculoskeletal: She exhibits no edema.  Skin: She is not diaphoretic.  Psychiatric: She has a normal mood and affect.    Lab Results Lab Results  Component Value Date   WBC 3.8 (L) 12/07/2017   HGB 7.7 (L) 12/07/2017   HCT 24.6 (L) 12/07/2017   MCV 87.2 12/07/2017   PLT 114 (L) 12/07/2017    Lab Results  Component  Value Date   CREATININE 0.40 (L) 12/07/2017   BUN <5 (L) 12/07/2017   NA 136 12/07/2017   K 3.0 (L) 12/07/2017   CL 100 (L) 12/07/2017   CO2 27 12/07/2017    Lab Results  Component Value Date   ALT 12 (L) 12/05/2017   AST 19 12/05/2017   ALKPHOS 71 12/05/2017   BILITOT 0.5 12/05/2017     Microbiology: Recent Results (from the past 240 hour(s))  Culture, blood (Routine x 2)     Status: None   Collection Time: 12/01/17 11:40 AM  Result Value Ref Range Status   Specimen Description BLOOD LEFT ANTECUBITAL  Final   Special Requests   Final    BOTTLES DRAWN AEROBIC AND ANAEROBIC Blood Culture adequate volume   Culture   Final    NO GROWTH 5 DAYS Performed at [facility], [address], [place], [place] [address]    Report Status 12/06/2017 FINAL  Final  Culture, blood (Routine x 2)     Status: None   Collection Time: 12/01/17 12:39 PM  Result Value Ref Range Status   Specimen Description BLOOD RIGHT ANTECUBITAL  Final   Special Requests   Final    BOTTLES DRAWN AEROBIC AND ANAEROBIC Blood Culture adequate volume   Culture   Final    NO GROWTH 5 DAYS Performed at [facility], [address] [place], [place] [address]    Report Status 12/06/2017 FINAL   Final  C difficile quick scan w PCR reflex     Status: None   Collection Time: 12/01/17 12:55 PM  Result Value Ref Range Status   C Diff antigen NEGATIVE NEGATIVE Final   C Diff toxin NEGATIVE NEGATIVE Final   C Diff interpretation No C. difficile detected.  Final    Comment: Performed at [facility], [address] [place], [place] [address]  Gastrointestinal Panel by PCR , Stool     Status: None   Collection Time: 12/01/17 12:55 PM  Result Value Ref Range Status   Campylobacter species NOT DETECTED NOT DETECTED Final   Plesimonas shigelloides NOT DETECTED NOT DETECTED Final   Salmonella species NOT DETECTED NOT DETECTED Final   Yersinia enterocolitica NOT DETECTED NOT DETECTED Final   Vibrio species NOT DETECTED NOT DETECTED Final   Vibrio cholerae NOT DETECTED NOT DETECTED Final   Enteroaggregative E coli (EAEC) NOT DETECTED NOT DETECTED Final   Enteropathogenic E coli (EPEC) NOT DETECTED NOT DETECTED Final   Enterotoxigenic E coli (ETEC) NOT DETECTED NOT DETECTED Final   Shiga like toxin producing E coli (STEC) NOT DETECTED NOT DETECTED Final   Shigella/Enteroinvasive E coli (EIEC) NOT DETECTED NOT DETECTED Final   Cryptosporidium NOT DETECTED NOT DETECTED Final   Cyclospora cayetanensis NOT DETECTED NOT DETECTED Final   Entamoeba histolytica NOT DETECTED NOT DETECTED Final   Giardia lamblia NOT DETECTED NOT DETECTED Final   Adenovirus F40/41 NOT DETECTED NOT DETECTED Final   Astrovirus NOT DETECTED NOT DETECTED Final   Norovirus GI/GII NOT DETECTED NOT DETECTED Final   Rotavirus A NOT DETECTED NOT DETECTED Final   Sapovirus (I, II, IV, and V) NOT DETECTED NOT DETECTED Final    Comment: Performed at [facility], [address] [place], [place] [address]  Urine culture     Status: Abnormal   Collection Time: 12/01/17  3:25 PM  Result Value Ref Range Status   Specimen Description URINE, CLEAN CATCH  Final   Special Requests  Final    NONE Performed at  [facility], [address] [place], [place] [address]    Culture >=100,000 COLONIES/mL ESCHERICHIA COLI (A)  Final   Report Status 12/04/2017 FINAL  Final   Organism ID, Bacteria ESCHERICHIA COLI (A)  Final      Susceptibility   Escherichia coli - MIC*    AMPICILLIN >=32 RESISTANT Resistant     CEFAZOLIN >=64 RESISTANT Resistant     CEFTRIAXONE 8 SENSITIVE Sensitive     CIPROFLOXACIN >=4 RESISTANT Resistant     GENTAMICIN >=16 RESISTANT Resistant     IMIPENEM <=0.25 SENSITIVE Sensitive     NITROFURANTOIN <=16 SENSITIVE Sensitive     TRIMETH/SULFA >=320 RESISTANT Resistant     AMPICILLIN/SULBACTAM >=32 RESISTANT Resistant     PIP/TAZO >=128 RESISTANT Resistant     Extended ESBL NEGATIVE Sensitive     * >=100,000 COLONIES/mL ESCHERICHIA COLI  MRSA PCR Screening     Status: Abnormal   Collection Time: 12/01/17 10:21 PM  Result Value Ref Range Status   MRSA by PCR POSITIVE (A) NEGATIVE Final    Comment:        The GeneXpert MRSA Assay (FDA approved for NASAL specimens only), is one component of a comprehensive MRSA colonization surveillance program. It is not intended to diagnose MRSA infection nor to guide or monitor treatment for MRSA infections. RESULT CALLED TO, READ BACK BY AND VERIFIED WITHSalena Saner [facility] RN 12/02/17 0256 JDW Performed at [facility], [address], [place], [place] [address]     Rexene Alberts, MSN, NP-C [facility] Cell: [phone] Pager: [phone]  12/07/2017 11:57 AM

## 2017-12-07 NOTE — Progress Notes (Signed)
Pharmacy Antibiotic Note  Julie Bird is a 52 y.o. female [facility] on 12[facility]01/2017 [facility]  Pharmacy consulted for Merrem. Ecoli is resistant to multiple organisms with a high MIC to Rocephin.  Renal function is stable.  Afebrile, WBC WNL.   Plan: Merrem 1gm IV Q8H Pharmacy will sign off as dosage adjustment is likely unnecessary.  Thank you for the consult.  Abx LOT/narrowing per MD (today is day #6 of therapy)   Height: 5\' 5"  (165.1 cm) Weight: 264 lb 12.4 oz (120.1 kg) IBW/kg (Calculated) : 57  Temp (24hrs), Avg:98.2 F (36.8 C), Min:98.1 F (36.7 C), Max:98.3 F (36.8 C)  Recent Labs  Lab 12/01/17 1202 12/01/17 1526  12/03/17 1109 12/04/17 0547 12/04/17 2037 12/05/17 0500 12/05/17 0600 12/06/17 0930 12/07/17 0452  WBC  --   --    < > 4.8 3.6* 4.2 3.6*  --  4.3 3.8*  CREATININE  --   --    < > 0.48 0.39*  --   --  0.37* 0.46 0.40*  LATICACIDVEN 1.48 1.24  --   --   --   --   --   --   --   --    < > = values in this interval not displayed.    Estimated Creatinine Clearance: 109.2 mL/min (A) (by C-G formula based on SCr of 0.4 mg/dL (L)).    Allergies  Allergen Reactions  . Codeine Shortness Of Breath  . Lortab [Hydrocodone-Acetaminophen] Shortness Of Breath  . Morphine And Related Shortness Of Breath    Zosyn 4/11>>4/14 Merrem 4/14 >>  4/11 C.diff - negative 4/11 GI panel PCR -negative 4/11 MRSA PCR - positive 4/11 UCx - Ecoli (c/w ESBL even though negative, high MIC CTX so use Merrem) 4/11 BCx - negative    Edris Schneck D. Laney Potashang, PharmD, BCPS, BCCCP Pager:  [phone] 12/07/2017, 12:49 PM

## 2017-12-07 NOTE — Progress Notes (Signed)
[facility] Coord[facility] will follow pt with ID team to support home IV ABX at DC as ordered/needed.  [facility] [place].  If patient discharges after hours, please call [phone].   Julie Bird 12/07/2017, 11:43 AM

## 2017-12-07 NOTE — Progress Notes (Signed)
Patient was hypokalemic to 3.0 this AM. IV KCL 10 mEq x6 was orderd. Patient received 2x full runs and partial 3rd. Per nurse, pt had only received a small amount of the 3rd run. -Will stop IV KC -Order KDur 40 mg x1.  -Recheck K+ in PM

## 2017-12-07 NOTE — Discharge Instructions (Addendum)
Colitis Colitis is inflammation of the colon. Colitis may last a short time (acute) or it may last a long time (chronic). What are the causes? This condition may be caused by:  Viruses.  Bacteria.  Reactions to medicine.  Certain autoimmune diseases, such as Crohn disease or ulcerative colitis.  What are the signs or symptoms? Symptoms of this condition include:  Diarrhea.  Passing bloody or tarry stool.  Pain.  Fever.  Vomiting.  Tiredness (fatigue).  Weight loss.  Bloating.  Sudden increase in abdominal pain.  Having fewer bowel movements than usual.  How is this diagnosed? This condition is diagnosed with a stool test or a blood test. You may also have other tests, including X-rays, a CT scan, or a colonoscopy. How is this treated? Treatment may include:  Resting the bowel. This involves not eating or drinking for a period of time.  Fluids that are given through an IV tube.  Medicine for pain and diarrhea.  Antibiotic medicines.  Cortisone medicines.  Surgery.  Follow these instructions at home: Eating and drinking  Follow instructions from your health care provider about eating or drinking restrictions.  Drink enough fluid to keep your urine clear or pale yellow.  Work with a dietitian to determine which foods cause your condition to flare up.  Avoid foods that cause flare-ups.  Eat a well-balanced diet. Medicines  Take over-the-counter and prescription medicines only as told by your health care provider.  If you were prescribed an antibiotic medicine, take it as told by your health care provider. Do not stop taking the antibiotic even if you start to feel better. General instructions  Keep all follow-up visits as told by your health care provider. This is important. Contact a health care provider if:  Your symptoms do not go away.  You develop new symptoms. Get help right away if:  You have a fever that does not go away with  treatment.  You develop chills.  You have extreme weakness, fainting, or dehydration.  You have repeated vomiting.  You develop severe pain in your abdomen.  You pass bloody or tarry stool. This information is not intended to replace advice given to you by your health care provider. Make sure you discuss any questions you have with your health care provider. Document Released: 09/16/2004 Document Revised: 01/15/2016 Document Reviewed: 12/02/2014 Elsevier Interactive Patient Education  2018 Elsevier Inc.   Nutrition [facility] Stay Proper nutrition can help your body recover from illness and injury.   Foods and beverages high in protein, vitamins, and minerals help rebuild muscle loss, promote healing, & reduce fall risk.   In addition to eating healthy foods, a nutrition shake is an easy, delicious way to get the nutrition you need during and after your hospital stay  It is recommended that you continue to drink 2 bottles per day of:       Premier Protein for at least 1 month (30 days) after your hospital stay   Tips for adding a nutrition shake into your routine: As allowed, drink one with vitamins or medications instead of water or juice Enjoy one as a tasty mid-morning or afternoon snack Drink cold or make a milkshake out of it Drink one instead of milk with cereal or snacks Use as a coffee creamer   Available at the following grocery stores and pharmacies:           * Karin Golden * [employer] * [employer]co  * Rite Aid          *  [employer] * [employer]  * Walgreens      * Target  * BJ's   * CVS  * [employer]   * [facility] [phone]            For COUPONS visit: www.ensure.com/join or [email or web address]/[email or web address]   Suggested Substitutions Ensure Plus = Boost Plus = Carnation Breakfast Essentials = Boost Compact Ensure Active Clear = Boost Breeze Glucerna Shake = Boost Glucose Control = Carnation Breakfast Essentials SUGAR FREE Premier Protein=  Ensure Max Protein     [facility] [phone]

## 2017-12-07 NOTE — Progress Notes (Signed)
Family Medicine Teaching Service Daily Progress Note Intern Pager: [phone]  Patient name: Julie Bird Medical record number: [id number] Date of birth: 08/17/1966 Age: 52 y.o. Gender: female  Primary Care Provider: Guy Begin, FNP Consultants: CCM, Surgery, GI Code Status: Full  Pt Overview and Major Events to Date:  Julie Bird is a 52 y.o. female presenting with nausea, vomiting and abdominal pain x2 months. PMH is significant for T2 DM, HLD, COPD, hypothyroidism, hypertension, GERD, anxiety and depression.   4/11 - Transferred to CCM and started on Neo for hypotension 4/13 - Transferred to FPTS   Assessment and Plan:  Pan-colitis with microperforation on chronic abdominal pain and nausea/diarhea Abdominal pain has resolved. Pt tolerating soft PO, wanting solid food.  GI recommending complete course of abx for 14 days. Given pt has been on meropenin, will likely need PICC and ID consult to continue ertapenim as outpatient -Appreciate GI recommendations --Zosyn ( 4/11-4/15), broadenend to Meropenem 1 g q8 (4/15-) in setting of resistant E coli UTI, appreciate recs on end date of abx --, ADAT  Pancytopenia WBC 3, Hg 7.7, Plt 80s, FOBT neg. HIV nge Iron studies consistant w/ anemia of chronic disease. Normal uric acid, LDH, retic count. Likely in the setting of chronic malnutrition. Peripheral smear consistant w/ reactive dz, pancytopenia -consider bone marrow biopsy outpatient  Hypokalemia,  Resistant to oral repletion. Will repleate w/ IV 10 mEqx6 - bmp this pm  Right lower lobe pneumonia Noted abdominal CT, not visualized on CXR. Satting well on 2L O2 (lower than home O2). Currently on Meropenem. --Contine to monitor respiratory status --Continue meropenem  UTI with Ecoli, multiple resistance to abx UA moderate leuks, neg nitrites. Urine culture pos for e coli . 100K colonies, resistant to multiple antibiotic.  -cont Meropenem 1 g  q8  HFpEF, diastolic  dysfunction Due to increasing dyspnea on admission, TTE was obtained. Showed EF 55-60%, mild basal hypertrophy.  --Daily weights --strict I/Os  Hypotension Currently hemodynamically stable, improved on stress dose steroids. Will initiate po steroid taper. Prednisone 40 and taker 10 per week until back to home does 5 mg --Will continue to monitor bp  Hypomagnasemia Mg 1.9 yesterday. Will replete 2g IV  Severe Calorie malnutrition Likely malabsorptive from chronic diarrhea.  -nutritional supplaments  COPD, stable On 2L [place] O2 which isbelow her home requirement of 4L. Uses albuterol nebs prn, fluticasone, daliresp, claritin, singular --Continue lbuterol prn, fluticasone, Dulera, Singulair, roflumilast, claritin -oral prednisone 40 mg (taper as above)  Hypothyroidism --Continue syntrhoid  HLD --Continue statin  Depression/Anxiety Takes zoloft 100 mg bid, risperidone 28m qhs, klonopin 2 mg four times a day prn. --Continue zoloft, risperidone --Clonazepam 2 mg bid prn  Deconditioning Concern for deconditioning given body habitus, minimal activity since admission, ICU and multiple medical problems. Consult for CIR evaluation will be placed. --PT/OT recommend SNF, pt requesting to go home w/ HHPT OT  FEN/GI: home PPx: LMWH  Disposition: likely home later today w/ HHPT/OT pending ID consult  Subjective:  No complaints of diarrhea or abdominal pain. Wanting to eat solids.   Objective: Temp:  [98.1 F (36.7 C)-98.3 F (36.8 C)] 98.1 F (36.7 C) (04/17 0543) Pulse Rate:  [101-111] 103 (04/17 0543) Resp:  [16-17] 17 (04/17 0543) BP: (125-136)/(63-84) 136/84 (04/17 0543) SpO2:  [92 %-97 %] 92 % (04/17 0814) Weight:  [264 lb 12.4 oz (120.1 kg)] 264 lb 12.4 oz (120.1 kg) (04/17 0543)   Physical Exam: Gen: morbidly obese, NAD, resting comfortably CV: RRR with no murmurs  appreciated Pulm: 2L O2 [place], NWOB, crackles right lower lobe GI: large central pannis, Soft, Nontender,   MSK: no edema, cyanosis, or clubbing noted Skin: warm, dry Extremity: no lower extremity edemea Neuro: grossly normal, moves all extremities Psych: Normal affect and thought content  Laboratory: Recent Labs  Lab 12/05/17 0500 12/06/17 0930 12/07/17 0452  WBC 3.6* 4.3 3.8*  HGB 8.2* 7.9* 7.7*  HCT 25.7* 25.4* 24.6*  PLT 84* 100* 114*   Recent Labs  Lab 12/01/17 1132  12/02/17 0429  12/05/17 0600 12/06/17 0930 12/07/17 0452  NA 126*   < > 130*   < > 135 135 136  K 4.2   < > 4.2   < > 2.8* 2.9* 3.0*  CL 88*   < > 97*   < > 100* 98* 100*  CO2 24   < > 21*   < > _0 BUN 10   < > 8   < > <5* <5* <5*  CREATININE 0.73   < > 0.47   < > 0.37* 0.46 0.40*  CALCIUM 8.8*   < > 7.9*   < > 7.4* 8.2* 8.3*  PROT 6.3*  --  5.6*  --  4.8*  --   --   BILITOT 0.7  --  0.6  --  0.5  --   --   ALKPHOS 100  --  82  --  71  --   --   ALT 12*  --  12*  --  12*  --   --   AST 23  --  20  --  19  --   --   GLUCOSE 115*   < > 103*   < > 93 136* 95   < > = values in this interval not displayed.   Ct Abdomen Pelvis W Contrast  Result Date: 12/04/2017 CLINICAL DATA:  Recent diverticulitis EXAM: CT ABDOMEN AND PELVIS WITH CONTRAST TECHNIQUE: Multidetector CT imaging of the abdomen and pelvis was performed using the standard protocol following bolus administration of intravenous contrast. Oral contrast was also administered. CONTRAST:  122m ISOVUE-300 IOPAMIDOL (ISOVUE-300) INJECTION 61% COMPARISON:  December 01, 2017 FINDINGS: Lower chest: There is infiltrate in the right middle lobe, not present on most recent study. There is also consolidation with pleural effusion in the right lower lobe, increased from recent study. There is a new small left pleural effusion with left base atelectasis. Hepatobiliary: Liver measures 22.4 cm in length. There is diffuse hepatic steatosis. No focal liver lesions are evident. Gallbladder is absent. There is no biliary duct dilatation. Pancreas: No pancreatic mass or  inflammatory focus. Spleen: Spleen measures 18.9 x 13.4 x 9.2 cm with a measured splenic volume of 1,165 cubic cm. No focal splenic lesions are evident. Adrenals/Urinary Tract: Adrenals bilaterally appear normal. Kidneys bilaterally show no evident mass or hydronephrosis. No renal or ureteral calculus evident. Urinary bladder is midline with wall thickness within normal limits. Stomach/Bowel: There is extensive wall thickening throughout essentially the entire right colon as well as portions of the proximal transverse colon. There is wall thickening throughout much of the descending colon and sigmoid colon as well. Note that there are irregular diverticula again noted in the distal descending and sigmoid colon regions. No well-defined abscess is seen. The area of perforation at the junction of the descending and sigmoid colon is again noted with oral contrast extending through the wall into this area. No air is seen in this area. This area is  no larger than on the previous study and may be some slightly smaller at this time. No small bowel lesions are evident. No small bowel obstruction. There is persistent mesenteric stranding and thickening in the right upper pelvis. There is slightly more ill-defined fluid in this area consistent with loculated ascites. No free air or portal venous air is evident. Vascular/Lymphatic: There is aortic and iliac artery atherosclerosis without evident aneurysm. Major mesenteric vessels appear patent. No adenopathy is appreciable in the abdomen or pelvis. Reproductive: Uterus is anteverted. No uterine or ovarian mass evident. Other: Appendix appears unremarkable. No abscess is evident in the abdomen or pelvis. There is a small ventral hernia containing only fat. There is edema throughout the posterior and lateral abdominal walls. A lesser degree of edema is noted in the anterior abdominal wall. A small amount of air is noted in the anterior abdominal wall, likely due to injection.  Musculoskeletal: There is degenerative change in the lumbar spine. No blastic or lytic bone lesions noted. No intramuscular lesions are demonstrated. IMPRESSION: 1. Extensive changes of colitis involving the right colon and proximal transverse colon. Colitis with apparent superimposed diverticulitis throughout much of the left colon and sigmoid colon. Small perforation near the junction of the descending and sigmoid colon is again noted, marginally smaller. No abscess evident. There is an increase in inflammation in the right upper pelvis with mild loculated ascites in this area, a finding not present previously. No small bowel inflammation evident. 2. No well-defined abscess in the abdomen or pelvis. No bowel obstruction. Appendix region appears normal. 3. Areas of pneumonia in the right middle and lower lobes with increase in consolidation in the right lower lobe and new consolidation in portions of the right middle lobe compared to recent prior study. There are pleural effusions bilaterally, fairly small but increased on the right and new on the left. 4.  Splenomegaly.  No focal splenic lesions. 5.  Hepatomegaly with diffuse hepatic steatosis. 6. Edema throughout portions of the abdominal and pelvic walls. Question a degree of underlying anasarca. 7.  Aortoiliac atherosclerosis. 8.  Small ventral hernia containing only fat. Aortic Atherosclerosis (ICD10-I70.0). Electronically Signed   By: Lowella Grip III M.D.   On: 12/04/2017 18:25   Ct Abdomen Pelvis W Contrast  Result Date: 12/01/2017 CLINICAL DATA:  Continued surveillance diverticulitis. Nausea, vomiting, and diarrhea for 4 months. EXAM: CT ABDOMEN AND PELVIS WITH CONTRAST TECHNIQUE: Multidetector CT imaging of the abdomen and pelvis was performed using the standard protocol following bolus administration of intravenous contrast. CONTRAST:  129m ISOVUE-300 IOPAMIDOL (ISOVUE-300) INJECTION 61% COMPARISON:  09/28/2017. FINDINGS: Lower chest: RIGHT  lower lobe infiltrate. This is a new finding from February. This affects primarily the posterior basal segment of the RIGHT lower lobe. No significant effusion. LEFT lung clear. Hepatobiliary: Steatosis. No focal liver abnormality. Cholecystectomy. Pancreas: Unremarkable. No pancreatic ductal dilatation or surrounding inflammatory changes. Low Spleen: Splenomegaly.  No focal lesions. Adrenals/Urinary Tract: Adrenal glands are unremarkable. Kidneys are normal, without renal calculi, focal lesion, or hydronephrosis. Bladder is unremarkable. Stomach/Bowel: Mild changes of descending/sigmoid colon colitis, with bowel wall thickening and reduction in luminal caliber. Mild mesenteric edema. Small 1.5 cm microperforation is seen at the proximal sigmoid region, series 3, image 80, more characteristic of diverticulitis. The ascending and transverse colon demonstrate bowel wall edema, new/increased from priors, raising the question of diffuse colitis. Infectious and inflammatory causes of colitis should be considered. No bowel obstruction. No free air or significant free fluid. Vascular/Lymphatic: Aortic atherosclerosis without enlarged  abdominal or pelvic lymph nodes. Reproductive: Uterus and bilateral adnexa are unremarkable. Other: Small periumbilical paramedian hernia containing fat. Slight associated mesenteric edema. Musculoskeletal: No acute or significant osseous findings. IMPRESSION: Mild increase in diffuse bowel wall thickening affecting not only the LEFT, but RIGHT and transverse colonic regions raising the question of diffuse colitis. Infectious and inflammatory causes should be considered. In the proximal descending region, there is a new microperforation, 1.5 cm in diameter, more characteristic of diverticulitis. Diverticulosis with diverticulitis was demonstrated on the previous scan, but may be mildly increased. Splenomegaly.  Hepatic steatosis. RIGHT lower lobe pneumonia, new from February. Electronically  Signed   By: Staci Righter M.D.   On: 12/01/2017 15:12   Dg Chest Port 1 View  Result Date: 12/01/2017 CLINICAL DATA:  Shortness of breath and cough EXAM: PORTABLE CHEST 1 VIEW COMPARISON:  09/28/2017 FINDINGS: The heart size and mediastinal contours are within normal limits. Both lungs are clear. The visualized skeletal structures are unremarkable. IMPRESSION: No active disease. Electronically Signed   By: Inez Catalina M.D.   On: 12/01/2017 12:34     Bonnita Hollow, MD 12/07/2017, 9:20 AM PGY-1, [place] Intern pager: [phone], text pages welcome

## 2017-12-07 NOTE — Progress Notes (Signed)
Nutrition Follow-up  DOCUMENTATION CODES:   Severe malnutrition in context of acute illness/injury  INTERVENTION:   -Continue MVI daily -Continue Premier Protein TID, each supplement provides 160 kcals and 30 grams protein (chocolate flavor)  NUTRITION DIAGNOSIS:   Severe Malnutrition related to acute illness(N/V/D with pancolitis, diverticulitis with microperforation) as evidenced by energy intake < or equal to 50% for > or equal to 5 days, percent weight loss, moderate muscle depletion.  Progressing  GOAL:   Patient will meet greater than or equal to 90% of their needs  Ongoing  MONITOR:   PO intake, Supplement acceptance, Diet advancement, Labs, Weight trends  REASON FOR ASSESSMENT:   Consult Assessment of nutrition requirement/status  ASSESSMENT:   52 yo female admitted with N/V/D/abdominal discomfort for 2 months ago (but first started to notice symptoms 9 months ago), hypovolemic hyponatremia. Pt with septic shock/severe sepsis with pan colitis, possible diverticulitis with microperforation, secretory diarrea, possible gastroparesis. Pt with hx of DM, COPD, HLD, HTN, GERD  4/12- advanced to full liquid diet (did not tolerate clears well due to indigestion)  Pt receiving nursing care at time of visit.   Pt remains on a full liquid diet. Per discussion with RN, tolerating well. She is accepting both Premier Protein and MVI supplements.   Per RNCM notes, plans to d/c to home with home health services once medically stable. RD provided written information in discharge instructions for pt to continue supplements at home.   Labs reviewed: K: 3.0, CBGS: 109-200.   Diet Order:  Diet full liquid Room service appropriate? Yes; Fluid consistency: Thin  EDUCATION NEEDS:   Education needs have been addressed  Skin:  Skin Assessment: Skin Integrity Issues: Skin Integrity Issues:: DTI DTI: coccyx  Last BM:  12/06/17  Height:   Ht Readings from Last 1 Encounters:   12/02/17 5\' 5"  (1.651 m)    Weight:   Wt Readings from Last 1 Encounters:  12/07/17 264 lb 12.4 oz (120.1 kg)    Ideal Body Weight:  56.8 kg  BMI:  Body mass index is 44.06 kg/m.  Estimated Nutritional Needs:   Kcal:  2000-2200 kcals  Protein:  113-125 g  Fluid:  >/= 2 L    Sosie Gato A. Mayford KnifeWilliams, RD, LDN, CDE Pager: [phone] After hours Pager: [phone]

## 2017-12-07 NOTE — Progress Notes (Signed)
Physical Therapy Treatment Patient Details Name: Julie Bird MRN: [id number] DOB: [birth year] Today's Date: 12/07/2017    History of Present Illness Pt is a 52 year old woman admitted 12/01/17 with FTT with chronic N/V/D and hypotension. Pt with colitis vs diverticulitis vs gastroparesis. PMH: anxiety, depression, COPD on 4L 02 at home, HTN, diverticulosis.    PT Comments    Pt performed therapeutic exercises in supine position after refusing OOB activity.  Pt reports she has been getting dizzy with movements, including: rolling and sitting edge of bed.  Will inform supervising PT of new complaints.  Pt remains to require SNF based on decreased strength and need for external assistance to perform simple tasks.      Follow Up Recommendations  SNF;Supervision/Assistance - 24 hour     Equipment Recommendations  None recommended by PT    Recommendations for Other Services       Precautions / Restrictions Precautions Precautions: Fall Restrictions Weight Bearing Restrictions: No    Mobility  Bed Mobility Overal bed mobility: Needs Assistance             General bed mobility comments: Pt required cues for hand placement on bed rails to assist in boosting to [facility].  Max+1 with use of bed pad.    Transfers Overall transfer level: (Pt refused transfer OOB this pm.  )                  Ambulation/Gait                 Stairs             Wheelchair Mobility    Modified Rankin (Stroke Patients Only)       Balance                                            Cognition Arousal/Alertness: Awake/alert Behavior During Therapy: WFL for tasks assessed/performed Overall Cognitive Status: Within Functional Limits for tasks assessed                                        Exercises General Exercises - Lower Extremity Ankle Circles/Pumps: AROM;Both;20 reps;Supine Quad Sets: AROM;Both;10 reps;Supine Short Arc Quad:  AROM;Both;10 reps;Supine Hip ABduction/ADduction: AAROM;Both;10 reps;Supine    General Comments        Pertinent Vitals/Pain Pain Assessment: 0-10 Pain Score: 4  Pain Location: Generalized pain with movement.  Pain Descriptors / Indicators: Grimacing;Guarding;Sore Pain Intervention(s): Monitored during session;Repositioned    Home Living                      Prior Function            PT Goals (current goals can now be found in the care plan section) Acute Rehab PT Goals Patient Stated Goal: to get stronger Potential to Achieve Goals: Good Progress towards PT goals: Progressing toward goals    Frequency    Min 3X/week      PT Plan Current plan remains appropriate    Co-evaluation              AM-PAC PT "6 Clicks" Daily Activity  Outcome Measure  Difficulty turning over in bed (including adjusting bedclothes, sheets and blankets)?: Unable Difficulty moving from lying on back to sitting  on the side of the bed? : Unable Difficulty sitting down on and standing up from a chair with arms (e.g., wheelchair, bedside commode, etc,.)?: Unable Help needed moving to and from a bed to chair (including a wheelchair)?: Total Help needed walking in hospital room?: Total Help needed climbing 3-5 steps with a railing? : Total 6 Click Score: 6    End of Session Equipment Utilized During Treatment: Gait belt Activity Tolerance: Patient limited by fatigue Patient left: in bed;with call bell/phone within reach(bed placed in chair position to improve lung function.  ) Nurse Communication: Mobility status PT Visit Diagnosis: Other abnormalities of gait and mobility (R26.89);Muscle weakness (generalized) (M62.81)     Time: 1610-9604 PT Time Calculation (min) (ACUTE ONLY): 18 min  Charges:  $Therapeutic Exercise: 8-22 mins                    G Codes:       Joycelyn Rua, PTA pager [phone]    Florestine Avers 12/07/2017, 5:57 PM

## 2017-12-08 LAB — CBC
HCT: 24.1 % — ABNORMAL LOW (ref 36.0–46.0)
HEMATOCRIT: 23 % — AB (ref 36.0–46.0)
HEMOGLOBIN: 7 g/dL — AB (ref 12.0–15.0)
HEMOGLOBIN: 7.4 g/dL — AB (ref 12.0–15.0)
MCH: 27.5 pg (ref 26.0–34.0)
MCH: 27.6 pg (ref 26.0–34.0)
MCHC: 30.4 g/dL (ref 30.0–36.0)
MCHC: 30.7 g/dL (ref 30.0–36.0)
MCV: 90.2 fL (ref 78.0–100.0)
MCV: 89.9 fL (ref 78.0–100.0)
Platelets: 121 10*3/uL — ABNORMAL LOW (ref 150–400)
Platelets: 114 10*3/uL — ABNORMAL LOW (ref 150–400)
RBC: 2.55 MIL/uL — AB (ref 3.87–5.11)
RBC: 2.68 MIL/uL — AB (ref 3.87–5.11)
RDW: 15.3 % (ref 11.5–15.5)
RDW: 15.3 % (ref 11.5–15.5)
WBC: 3.5 10*3/uL — ABNORMAL LOW (ref 4.0–10.5)
WBC: 3.4 10*3/uL — ABNORMAL LOW (ref 4.0–10.5)

## 2017-12-08 LAB — GLUCOSE, CAPILLARY
GLUCOSE-CAPILLARY: 92 mg/dL (ref 65–99)
Glucose-Capillary: 106 mg/dL — ABNORMAL HIGH (ref 65–99)
Glucose-Capillary: 167 mg/dL — ABNORMAL HIGH (ref 65–99)
Glucose-Capillary: 122 mg/dL — ABNORMAL HIGH (ref 65–99)

## 2017-12-08 LAB — BASIC METABOLIC PANEL
Anion gap: 10 (ref 5–15)
BUN: <5 mg/dL — ABNORMAL LOW (ref 6–20)
CHLORIDE: 99 mmol/L — AB (ref 101–111)
CO2: 26 mmol/L (ref 22–32)
CREATININE: 0.42 mg/dL — AB (ref 0.44–1.00)
Calcium: 8.1 mg/dL — ABNORMAL LOW (ref 8.9–10.3)
GFR calc non Af Amer: >60 mL/min (ref >60)
Glucose, Bld: 113 mg/dL — ABNORMAL HIGH (ref 65–99)
POTASSIUM: 3.4 mmol/L — AB (ref 3.5–5.1)
Sodium: 135 mmol/L (ref 135–145)

## 2017-12-08 MED ORDER — PREDNISONE 20 MG PO TABS: 40.0000 mg | ORAL_TABLET | Freq: Every day | ORAL | 0 refills | Status: AC

## 2017-12-08 MED ORDER — NYSTATIN 100000 UNIT/ML MT SUSP: 5.0000 mL | Freq: Four times a day (QID) | OROMUCOSAL | 0 refills | Status: AC

## 2017-12-08 MED ORDER — ONDANSETRON HCL 4 MG/2ML IJ SOLN: 4.0000 mg | Freq: Four times a day (QID) | INTRAMUSCULAR | 0 refills | Status: DC | PRN

## 2017-12-08 MED ORDER — PREMIER PROTEIN SHAKE: 11.0000 [oz_av] | Freq: Three times a day (TID) | ORAL | 0 refills | Status: AC

## 2017-12-08 MED ORDER — VITAMIN D (ERGOCALCIFEROL) 1.25 MG (50000 UNIT) PO CAPS: 50000.0000 [IU] | ORAL_CAPSULE | ORAL | 0 refills | Status: AC

## 2017-12-08 MED ORDER — LEVOFLOXACIN 500 MG PO TABS
500.0000 mg | ORAL_TABLET | Freq: Every day | ORAL | Status: DC
  Administered 2017-12-08 – 2017-12-09 (×2): 500 mg via ORAL
  Filled 2017-12-08 (×2): qty 1

## 2017-12-08 MED ORDER — PREMIER PROTEIN SHAKE
2.0000 [oz_av] | Freq: Four times a day (QID) | ORAL | Status: DC
  Administered 2017-12-08: 2 [oz_av] via ORAL
  Filled 2017-12-08 (×10): qty 325.31

## 2017-12-08 MED ORDER — ONDANSETRON HCL 4 MG PO TABS: 4.0000 mg | ORAL_TABLET | Freq: Every day | ORAL | 0 refills | Status: AC | PRN

## 2017-12-08 MED ORDER — LEVOFLOXACIN 500 MG PO TABS: 500.0000 mg | ORAL_TABLET | Freq: Every day | ORAL | 0 refills | Status: AC

## 2017-12-08 MED ORDER — PREDNISONE 20 MG PO TABS: 20.0000 mg | ORAL_TABLET | Freq: Every day | ORAL | 0 refills | Status: AC

## 2017-12-08 MED ORDER — METRONIDAZOLE 500 MG PO TABS
500.0000 mg | ORAL_TABLET | Freq: Three times a day (TID) | ORAL | Status: DC
  Administered 2017-12-08 – 2017-12-09 (×4): 500 mg via ORAL
  Filled 2017-12-08 (×4): qty 1

## 2017-12-08 MED ORDER — CLONAZEPAM 1 MG PO TABS
1.0000 mg | ORAL_TABLET | Freq: Every evening | ORAL | Status: DC | PRN
  Administered 2017-12-08: 1 mg via ORAL
  Filled 2017-12-08: qty 1

## 2017-12-08 MED ORDER — PREDNISONE 10 MG PO TABS: 10.0000 mg | ORAL_TABLET | Freq: Every day | ORAL | 0 refills | Status: AC

## 2017-12-08 MED ORDER — PANTOPRAZOLE SODIUM 40 MG PO TBEC: 40.0000 mg | DELAYED_RELEASE_TABLET | Freq: Every day | ORAL | 0 refills | Status: AC

## 2017-12-08 MED ORDER — METRONIDAZOLE 500 MG PO TABS: 500.0000 mg | ORAL_TABLET | Freq: Three times a day (TID) | ORAL | 0 refills | Status: AC

## 2017-12-08 MED ORDER — PREDNISONE 5 MG PO TABS: 5.0000 mg | ORAL_TABLET | Freq: Every morning | ORAL | Status: AC

## 2017-12-08 MED ORDER — ADULT MULTIVITAMIN W/MINERALS CH: 1.0000 | ORAL_TABLET | Freq: Every day | ORAL | 0 refills | Status: AC

## 2017-12-08 MED ORDER — ROFLUMILAST 500 MCG PO TABS: 500.0000 ug | ORAL_TABLET | Freq: Every day | ORAL | 0 refills | Status: AC

## 2017-12-08 MED ORDER — PREDNISONE 10 MG PO TABS: 30.0000 mg | ORAL_TABLET | Freq: Every day | ORAL | 0 refills | Status: AC

## 2017-12-08 MED ORDER — THERAPEUTIC MULTIVIT/MINERAL PO TABS: 1.0000 | ORAL_TABLET | Freq: Every day | ORAL | 0 refills | Status: AC

## 2017-12-08 NOTE — Progress Notes (Signed)
   12/08/17 1539  What Happened  Was fall witnessed? Yes  Who witnessed fall? Jasmin Adger  Patients activity before fall to/from bed, chair, or stretcher  Point of contact buttocks  Was patient injured? No  Follow Up  MD notified Rumbal Alyson  Time MD notified [phone]  Family notified Yes-comment (Family was present )  Additional tests No  Progress note created (see row info) Yes  Adult Fall Risk Assessment  Risk Factor Category (scoring not indicated) High fall risk per protocol (document High fall risk)  Age 52  Patient's Fall Risk High Fall Risk (>13 points)  Adult Fall Risk Interventions  Required Bundle Interventions *See Row Information* High fall risk - low, moderate, and high requirements implemented  Additional Interventions Family Supervision  Screening for Fall Injury Risk (To be completed on HIGH fall risk patients) - Assessing Need for Low Bed  Risk For Fall Injury- Low Bed Criteria Previous fall this admission  Screening for Fall Injury Risk (To be completed on HIGH fall risk patients who do not meet crieteria for Low Bed) - Assessing Need for Floor Mats Only  Risk For Fall Injury- Criteria for Floor Mats None identified - No additional interventions needed  Vitals  Temp 97.6 F (36.4 C)  Temp Source Oral  BP (!) 152/122  MAP (mmHg) 130  BP Location Right Leg  BP Method Automatic  Patient Position (if appropriate) Sitting  Pulse Rate (!) 135  Oxygen Therapy  SpO2 93 %  O2 Device Nasal Cannula  O2 Flow Rate (L/min) 4 L/min  Pain Assessment  Pain Scale 0-10  Pain Score 0  Neurological  Neuro (WDL) WDL  Level of Consciousness Alert  Orientation Level Oriented X4  Cognition Appropriate at baseline  Speech Clear  Pupil Assessment  No  Motor Function/Sensation Assessment Grip;Motor response  R Hand Grip Weak  L Hand Grip Weak   RUE Motor Response Purposeful movement  RUE Motor Strength 5  LUE Motor Response Purposeful movement  LUE Motor Strength 5  RLE  Motor Response Purposeful movement  RLE Motor Strength 5  LLE Motor Response Purposeful movement  LLE Motor Strength 5  Musculoskeletal  Musculoskeletal (WDL) X  Assistive Device Wheelchair  Generalized Weakness Yes  Integumentary  Integumentary (WDL) X  Skin Color Appropriate for ethnicity  Skin Condition Dry  Skin Integrity MASD  Moisture Associated Skin Damage Location Other (Comment) (inner thighs, abd folds, buttocks)  Moisture Associated Skin Damage Orientation Right;Left;Medial  Moisture Associated Skin Damage Intervention Cleansed;Barrier cream  Skin Turgor Non-tenting

## 2017-12-08 NOTE — Care Management (Signed)
ED CM received call from Joy RN on 6N concerning patient needing bariatric w/c. CM noted [place] lives in [place]. Patient reports using [facility] in [place]. CM faxed orders via [facility]. Patient will be transported home by [facility] awaiting pick-up 6N staff aware. No further CM needs identified

## 2017-12-08 NOTE — Social Work (Addendum)
CSW aware that pt is not able to transfer home in personal car. Per RN Leadership, requesting support with [facility] transport.   CSW called in request for [facility] transport, the dispatch stated they believe they can transport to pt's address in [place] and took pt information to put on [facility] transport list.   CSW has completed [facility] paperwork to go with pt at discharge. Also sent evening CSW number to floor [phone]. Please call that number with any additional questions.   CSW signing off. Please consult if any additional needs arise.  Doy HutchingIsabel H Tayquan Bird, LCSWA [facility] Clinical Social Work [phone]

## 2017-12-08 NOTE — Progress Notes (Signed)
Received a page from PT regarding patient's ability to be discharged home. Patient was being transferred from the bed to the wheelchair and had a very difficult time doing this. She required a lot of assistance from PT and nursing staff. She did not have any chest pain, shortness of breath, dizziness, palpations, or loss of consciousness during the attempted transfer. I went up to the patient's room and evaluated her. She was resting comfortably in bed. We discussed that the inpatient team and PT agree that it would be safest for her to go to SNF and that we are concerned about her ability to care for herself at home. She states that she understands our recommendation and she understands the risks of going home and she would still like to be discharged home tonight. She does not want to stay in the hospital another night. She lives at home with her husband and granddaughter. Her daughter also comes to the house daily to take care of her. Will proceed with discharge home tonight via EMS.  Willadean CarolKaty Mayo, MD PGY-3

## 2017-12-08 NOTE — Progress Notes (Signed)
Family Medicine Teaching Service Daily Progress Note Intern Pager: [phone]  Patient name: Julie Bird Medical record number: [id number] Date of birth: 01-Feb-1966 Age: 52 y.o. Gender: female  Primary Care Provider: Guy Begin, FNP Consultants: CCM, Surgery, GI Code Status: Full  Pt Overview and Major Events to Date:  Julie Bird is a 52 y.o. female presenting with nausea, vomiting and abdominal pain x2 months. PMH is significant for T2 DM, HLD, COPD, hypothyroidism, hypertension, GERD, anxiety and depression.   4/11 - Transferred to CCM and started on Neo for hypotension 4/13 - Transferred to FPTS   Assessment and Plan:  Pan-colitis with microperforation on chronic abdominal pain and nausea/diarhea Abdominal pain has resolved. Pt tolerating full diet. GI recommending complete course of abx for 14 days. ID recommended levofloxacin 500 qd and flagyl 500 TID for 8 days. Will stop meropenim -Appreciate GI recommendations --Abx course: Zosyn ( 4/11-4/15), Meropenem 1 g q8 (4/15-4/18), Levofloxacin  -stool studies osmal, Na, K sent and pending  Pancytopenia WBC 3.5, Hg 7.7 > 7.0, Plt 121, FOBT neg. HIV nge Iron studies consistant w/ anemia of chronic disease. Normal uric acid, LDH, retic count. Likely in the setting of chronic malnutrition. Peripheral smear consistant w/ reactive dz, pancytopenia - given decrease in Hg, will recheck this PM. If normal, d/c home -consider bone marrow biopsy outpatient  Hypokalemia, resolved 3.0 > 3.9  Right lower lobe pneumonia Noted abdominal CT, not visualized on CXR. Satting well on 2L O2 (lower than home O2). Currently on Meropenem. - transtion form miropenim to levofloxacin and metronidazole for 3 days  UTI with Ecoli, multiple resistance to abx UA moderate leuks, neg nitrites. Urine culture pos for e coli . 100K colonies, resistant to multiple antibiotic. No s/s of dysuria. Adequitely covered with meropenim course for 3 days  HFpEF,  diastolic dysfunction Due to increasing dyspnea on admission, TTE was obtained. Showed EF 55-60%, mild basal hypertrophy.  --Daily weights --strict I/Os  Hypotension Currently hemodynamically stable, improved on stress dose steroids. Will initiate po steroid taper. Prednisone 40 and taker 10 per week until back to home does 5 mg --Will continue to monitor bp  Hypomagnasemia Mg 1.9 yesterday. Will replete 2g IV  Severe Calorie malnutrition Likely malabsorptive from chronic diarrhea.  -nutritional supplaments  COPD, stable On 2L [place] O2 which isbelow her home requirement of 4L. Uses albuterol nebs prn, fluticasone, daliresp, claritin, singular --Continue lbuterol prn, fluticasone, Dulera, Singulair, roflumilast, claritin -oral prednisone 40 mg (taper as above)  Hypothyroidism --Continue syntrhoid  HLD --Continue statin  Depression/Anxiety Takes zoloft 100 mg bid, risperidone 37m qhs, klonopin 2 mg four times a day prn. --Continue zoloft, risperidone --Clonazepam 2 mg bid prn  Deconditioning Concern for deconditioning given body habitus, minimal activity since admission, ICU and multiple medical problems. Consult for CIR evaluation will be placed. --PT/OT recommend SNF, pt requesting to go home w/ HHPT OT  FEN/GI: home PPx: LMWH  Disposition: likely home later today w/ HHPT/OT pending ID consult  Subjective:  No complaints of diarrhea or abdominal pain. Wanting to eat solids.   Objective: Temp:  [98.2 F (36.8 C)-98.8 F (37.1 C)] 98.8 F (37.1 C) (04/18 0623) Pulse Rate:  [102-115] 110 (04/18 1233) Resp:  [16-18] 18 (04/18 1233) BP: (125-135)/(54-69) 133/54 (04/18 1233) SpO2:  [94 %-100 %] 98 % (04/18 1233) Weight:  [264 lb 12.4 oz (120.1 kg)-264 lb 15.9 oz (120.2 kg)] 264 lb 15.9 oz (120.2 kg) (04/18 07893   Physical Exam: Gen: morbidly obese, NAD, resting comfortably  CV: RRR with no murmurs appreciated Pulm: 2L O2 [place], NWOB, crackles right lower lobe GI: large  central pannis, Soft, Nontender,  MSK: no edema, cyanosis, or clubbing noted Skin: warm, dry Extremity: 2+ lower extremity edema Neuro: grossly normal, moves all extremities Psych: Normal affect and thought content  Laboratory: Recent Labs  Lab 12/07/17 0452 12/08/17 0523 12/08/17 0823  WBC 3.8* 3.5* 3.4*  HGB 7.7* 7.0* 7.4*  HCT 24.6* 23.0* 24.1*  PLT 114* 121* 114*   Recent Labs  Lab 12/02/17 0429  12/05/17 0600  12/07/17 0452 12/07/17 1447 12/08/17 0823  NA 130*   < > 135   < > 136 136 135  K 4.2   < > 2.8*   < > 3.0* 3.9 3.4*  CL 97*   < > 100*   < > 100* 101 99*  CO2 21*   < > 25   < > 27 25 26   BUN 8   < > <5*   < > <5* <5* <5*  CREATININE 0.47   < > 0.37*   < > 0.40* 0.47 0.42*  CALCIUM 7.9*   < > 7.4*   < > 8.3* 8.1* 8.1*  PROT 5.6*  --  4.8*  --   --   --   --   BILITOT 0.6  --  0.5  --   --   --   --   ALKPHOS 82  --  71  --   --   --   --   ALT 12*  --  12*  --   --   --   --   AST 20  --  19  --   --   --   --   GLUCOSE 103*   < > 93   < > 95 172* 113*   < > = values in this interval not displayed.   Ct Abdomen Pelvis W Contrast  Result Date: 12/04/2017 CLINICAL DATA:  Recent diverticulitis EXAM: CT ABDOMEN AND PELVIS WITH CONTRAST TECHNIQUE: Multidetector CT imaging of the abdomen and pelvis was performed using the standard protocol following bolus administration of intravenous contrast. Oral contrast was also administered. CONTRAST:  147m ISOVUE-300 IOPAMIDOL (ISOVUE-300) INJECTION 61% COMPARISON:  December 01, 2017 FINDINGS: Lower chest: There is infiltrate in the right middle lobe, not present on most recent study. There is also consolidation with pleural effusion in the right lower lobe, increased from recent study. There is a new small left pleural effusion with left base atelectasis. Hepatobiliary: Liver measures 22.4 cm in length. There is diffuse hepatic steatosis. No focal liver lesions are evident. Gallbladder is absent. There is no biliary duct  dilatation. Pancreas: No pancreatic mass or inflammatory focus. Spleen: Spleen measures 18.9 x 13.4 x 9.2 cm with a measured splenic volume of 1,165 cubic cm. No focal splenic lesions are evident. Adrenals/Urinary Tract: Adrenals bilaterally appear normal. Kidneys bilaterally show no evident mass or hydronephrosis. No renal or ureteral calculus evident. Urinary bladder is midline with wall thickness within normal limits. Stomach/Bowel: There is extensive wall thickening throughout essentially the entire right colon as well as portions of the proximal transverse colon. There is wall thickening throughout much of the descending colon and sigmoid colon as well. Note that there are irregular diverticula again noted in the distal descending and sigmoid colon regions. No well-defined abscess is seen. The area of perforation at the junction of the descending and sigmoid colon is again noted with oral contrast extending through  the wall into this area. No air is seen in this area. This area is no larger than on the previous study and may be some slightly smaller at this time. No small bowel lesions are evident. No small bowel obstruction. There is persistent mesenteric stranding and thickening in the right upper pelvis. There is slightly more ill-defined fluid in this area consistent with loculated ascites. No free air or portal venous air is evident. Vascular/Lymphatic: There is aortic and iliac artery atherosclerosis without evident aneurysm. Major mesenteric vessels appear patent. No adenopathy is appreciable in the abdomen or pelvis. Reproductive: Uterus is anteverted. No uterine or ovarian mass evident. Other: Appendix appears unremarkable. No abscess is evident in the abdomen or pelvis. There is a small ventral hernia containing only fat. There is edema throughout the posterior and lateral abdominal walls. A lesser degree of edema is noted in the anterior abdominal wall. A small amount of air is noted in the anterior  abdominal wall, likely due to injection. Musculoskeletal: There is degenerative change in the lumbar spine. No blastic or lytic bone lesions noted. No intramuscular lesions are demonstrated. IMPRESSION: 1. Extensive changes of colitis involving the right colon and proximal transverse colon. Colitis with apparent superimposed diverticulitis throughout much of the left colon and sigmoid colon. Small perforation near the junction of the descending and sigmoid colon is again noted, marginally smaller. No abscess evident. There is an increase in inflammation in the right upper pelvis with mild loculated ascites in this area, a finding not present previously. No small bowel inflammation evident. 2. No well-defined abscess in the abdomen or pelvis. No bowel obstruction. Appendix region appears normal. 3. Areas of pneumonia in the right middle and lower lobes with increase in consolidation in the right lower lobe and new consolidation in portions of the right middle lobe compared to recent prior study. There are pleural effusions bilaterally, fairly small but increased on the right and new on the left. 4.  Splenomegaly.  No focal splenic lesions. 5.  Hepatomegaly with diffuse hepatic steatosis. 6. Edema throughout portions of the abdominal and pelvic walls. Question a degree of underlying anasarca. 7.  Aortoiliac atherosclerosis. 8.  Small ventral hernia containing only fat. Aortic Atherosclerosis (ICD10-I70.0). Electronically Signed   By: Lowella Grip III M.D.   On: 12/04/2017 18:25   Ct Abdomen Pelvis W Contrast  Result Date: 12/01/2017 CLINICAL DATA:  Continued surveillance diverticulitis. Nausea, vomiting, and diarrhea for 4 months. EXAM: CT ABDOMEN AND PELVIS WITH CONTRAST TECHNIQUE: Multidetector CT imaging of the abdomen and pelvis was performed using the standard protocol following bolus administration of intravenous contrast. CONTRAST:  155m ISOVUE-300 IOPAMIDOL (ISOVUE-300) INJECTION 61% COMPARISON:   09/28/2017. FINDINGS: Lower chest: RIGHT lower lobe infiltrate. This is a new finding from February. This affects primarily the posterior basal segment of the RIGHT lower lobe. No significant effusion. LEFT lung clear. Hepatobiliary: Steatosis. No focal liver abnormality. Cholecystectomy. Pancreas: Unremarkable. No pancreatic ductal dilatation or surrounding inflammatory changes. Low Spleen: Splenomegaly.  No focal lesions. Adrenals/Urinary Tract: Adrenal glands are unremarkable. Kidneys are normal, without renal calculi, focal lesion, or hydronephrosis. Bladder is unremarkable. Stomach/Bowel: Mild changes of descending/sigmoid colon colitis, with bowel wall thickening and reduction in luminal caliber. Mild mesenteric edema. Small 1.5 cm microperforation is seen at the proximal sigmoid region, series 3, image 80, more characteristic of diverticulitis. The ascending and transverse colon demonstrate bowel wall edema, new/increased from priors, raising the question of diffuse colitis. Infectious and inflammatory causes of colitis should be considered.  No bowel obstruction. No free air or significant free fluid. Vascular/Lymphatic: Aortic atherosclerosis without enlarged abdominal or pelvic lymph nodes. Reproductive: Uterus and bilateral adnexa are unremarkable. Other: Small periumbilical paramedian hernia containing fat. Slight associated mesenteric edema. Musculoskeletal: No acute or significant osseous findings. IMPRESSION: Mild increase in diffuse bowel wall thickening affecting not only the LEFT, but RIGHT and transverse colonic regions raising the question of diffuse colitis. Infectious and inflammatory causes should be considered. In the proximal descending region, there is a new microperforation, 1.5 cm in diameter, more characteristic of diverticulitis. Diverticulosis with diverticulitis was demonstrated on the previous scan, but may be mildly increased. Splenomegaly.  Hepatic steatosis. RIGHT lower lobe  pneumonia, new from February. Electronically Signed   By: Staci Righter M.D.   On: 12/01/2017 15:12   Dg Chest Port 1 View  Result Date: 12/01/2017 CLINICAL DATA:  Shortness of breath and cough EXAM: PORTABLE CHEST 1 VIEW COMPARISON:  09/28/2017 FINDINGS: The heart size and mediastinal contours are within normal limits. Both lungs are clear. The visualized skeletal structures are unremarkable. IMPRESSION: No active disease. Electronically Signed   By: Inez Catalina M.D.   On: 12/01/2017 12:34     Bonnita Hollow, MD 12/08/2017, 1:25 PM PGY-1, [place] Intern pager: [phone], text pages welcome

## 2017-12-08 NOTE — Progress Notes (Signed)
Physical Therapy Treatment Patient Details Name: Julie Bird Frericks MRN: [id number] DOB: Sep 12, 1965 Today's Date: 12/08/2017    History of Present Illness Pt is a 52 year old woman admitted 12/01/17 with FTT with chronic N/V/D and hypotension. Pt with colitis vs diverticulitis vs gastroparesis. PMH: anxiety, depression, COPD on 4L 02 at home, HTN, diverticulosis.    PT Comments    PTA and OT passing by room when RN staff called for help to transfer patient after she had soiled her self.  Upon arrival patient sitting in a very small (personal WC from home) WC.  Attempted sit to stand with sara stedy but patient unable to achieve standing so maximove used for transport back to bed.  Pt required pericare in supine and nursing and CM informed of patient needs.  Pt is refusing SNF placement despite PT recs and will require a long length slide board and wide WC for d/c home.  Pt is not safe to transfer home by car and she will need ambulance transfer to safely enter her home.  RN is coordinating equipment for d/c home.      Follow Up Recommendations  SNF;Supervision/Assistance - 24 hour(pt is refusing SNF and will require HHPT at d/c.  )     Equipment Recommendations  Wheelchair cushion (measurements PT);Wheelchair (measurements PT)(sliding board ( long length )) Pt will likley need a wider width WC.    Recommendations for Other Services       Precautions / Restrictions Precautions Precautions: Fall Restrictions Weight Bearing Restrictions: No    Mobility  Bed Mobility           Sit to supine: Total assist;+2 for physical assistance(maximove.  )   General bed mobility comments: Pt placed back in supine via maximove.   Transfers Overall transfer level: Needs assistance Equipment used: (attempted sit to stand with sara stedy and only able to achieve partial standing.  Opted for transfer back to bed with maximove as patient was soiled.  ) Transfers: (attempted sit to stand in sara stedy  but required maximove for back to bed transfer.  )              Ambulation/Gait Ambulation/Gait assistance: (NT)               Stairs             Wheelchair Mobility    Modified Rankin (Stroke Patients Only)       Balance Overall balance assessment: Needs assistance   Sitting balance-Leahy Scale: Poor       Standing balance-Leahy Scale: Zero                              Cognition Arousal/Alertness: Awake/alert Behavior During Therapy: WFL for tasks assessed/performed Overall Cognitive Status: Within Functional Limits for tasks assessed                                        Exercises      General Comments        Pertinent Vitals/Pain Pain Assessment: Faces Faces Pain Scale: Hurts little more Pain Location: Generalized pain with movement.  Pain Descriptors / Indicators: Grimacing;Guarding;Sore Pain Intervention(s): Monitored during session;Repositioned    Home Living                      Prior Function  PT Goals (current goals can now be found in the care plan section) Acute Rehab PT Goals Patient Stated Goal: to get stronger Potential to Achieve Goals: Fair Progress towards PT goals: Progressing toward goals    Frequency    Min 3X/week      PT Plan Current plan remains appropriate    Co-evaluation PT/OT/SLP Co-Evaluation/Treatment: Yes Reason for Co-Treatment: Complexity of the patient's impairments (multi-system involvement);For patient/therapist safety PT goals addressed during session: Mobility/safety with mobility        AM-PAC PT "6 Clicks" Daily Activity  Outcome Measure  Difficulty turning over in bed (including adjusting bedclothes, sheets and blankets)?: Unable Difficulty moving from lying on back to sitting on the side of the bed? : Unable Difficulty sitting down on and standing up from a chair with arms (e.g., wheelchair, bedside commode, etc,.)?: Unable Help  needed moving to and from a bed to chair (including a wheelchair)?: Total Help needed walking in hospital room?: Total Help needed climbing 3-5 steps with a railing? : Total 6 Click Score: 6    End of Session Equipment Utilized During Treatment: Gait belt Activity Tolerance: Patient limited by fatigue Patient left: in bed;with call bell/phone within reach Nurse Communication: Mobility status;Need for lift equipment PT Visit Diagnosis: Other abnormalities of gait and mobility (R26.89);Muscle weakness (generalized) (M62.81)     Time: 1630-1700 PT Time Calculation (min) (ACUTE ONLY): 30 min  Charges:  $Therapeutic Activity: 8-22 mins                    G CodesJoycelyn Rua, PTA pager [phone]    Florestine Avers 12/08/2017, 5:44 PM

## 2017-12-08 NOTE — Progress Notes (Addendum)
PT Cancellation Note  Patient Details Name: Tamela Gammonracy Kissling MRN: [id number] DOB: 24-Sep-1965   Cancelled Treatment:    Reason Eval/Treat Not Completed: Other (comment);Fatigue/lethargy limiting ability to participate.  PT assigned to pt today came up to floor to check in as I saw the earlier events noted in chart.  PTA informed me that she just spent a significant amount of time assisting multiple staff members to get pt back to the bed (see PTA note).  After speaking with PTA, I went in to speak with the family re: our concerns for her at home and recommended equipment (better fitting WC and slide board as slide board transfers are likely safer at this point).  Family was present (female) and I discussed with him the slide board transfer and asked family/pt if she was willing to attempt practicing this evening.  She reported exhaustion and inability to physically attempt at this time.  [facility] is supposed to be coming to take her home.  I fear for her ability to stay home and out of the hospital both on a fall front and because her Hgb has been steadily falling over the last several days (down to 7.4 today).  I paged teaching service number re: my concerns.  PT will check to see if pt is here tomorrow and attempt slide board education.  Thanks,    Rollene Rotundaebecca B. Traniya Prichett, PT, DPT [phone]   12/08/2017, 6:02 PM

## 2017-12-08 NOTE — Progress Notes (Signed)
Occupational Therapy Treatment Patient Details Name: Julie Bird MRN: [id number] DOB: 08/05/1966 Today's Date: 12/08/2017    History of present illness Pt is a 52 year old woman admitted 12/01/17 with FTT with chronic N/V/D and hypotension. Pt with colitis vs diverticulitis vs gastroparesis. PMH: anxiety, depression, COPD on 4L 02 at home, HTN, diverticulosis.   OT comments  Pt sitting in w/c upon entering room with RN/NT present and requesting assist as pt having difficulty completing transfer from w/c. Attempted sit<>Stand with use of Stedy and +3 assist however pt only able to achieve partial stand, pt also incontinent of bowel and bladder at this time. Use of maximove to transfer w/c to bed total assist for safety; pt requiring MaxA+2 for rolling to L/R and total assist for peri-care after incontinent episode. Pt and family wishing to take pt home at this time, however have significant concerns as pt is currently a high fall risk. Continue to recommend SNF level care at time of discharge, however if pt choosing to return home recommend [facility] services. Follow up for equipment needs made as well. Will continue to follow while in acute setting.    Follow Up Recommendations  SNF(if refusing SNF recommend HHOT)    Equipment Recommendations  3 in 1 bedside commode;Wheelchair (measurements OT);Wheelchair cushion (measurements OT)          Precautions / Restrictions Precautions Precautions: Fall Restrictions Weight Bearing Restrictions: No       Mobility Bed Mobility Overal bed mobility: Needs Assistance         Sit to supine: Total assist;+2 for physical assistance(maximove)   General bed mobility comments: Pt placed back in supine via maximove.   Transfers Overall transfer level: Needs assistance Equipment used: (attempted sit<>stand with Stedy however pt only able to complete partial stand; use of maximove for transfer back to bed as pt was soiled ) Transfers: (ultimately  required maximove)           General transfer comment: total assist with use of maximove to transfer back to bed     Balance Overall balance assessment: Needs assistance   Sitting balance-Leahy Scale: Poor       Standing balance-Leahy Scale: Zero                             ADL either performed or assessed with clinical judgement   ADL Overall ADL's : Needs assistance/impaired                             Toileting- Clothing Manipulation and Hygiene: Total assistance;Bed level         General ADL Comments: Pt sitting up in w/c upon entering room with multiple RN's and NT present, pt having difficulty standing from w/c, use of maximove to transfer pt chair to bed; pt incontinent during transfer requiring total assist for pericare                        Cognition Arousal/Alertness: Awake/alert Behavior During Therapy: [facility] for tasks assessed/performed Overall Cognitive Status: Within Functional Limits for tasks assessed  Pertinent Vitals/ Pain       Pain Assessment: Faces Faces Pain Scale: Hurts little more Pain Location: Generalized pain with movement.  Pain Descriptors / Indicators: Grimacing;Guarding;Sore Pain Intervention(s): Monitored during session;Repositioned                                                          Frequency  Min 2X/week        Progress Toward Goals  OT Goals(current goals can now be found in the care plan section)  Progress towards OT goals: OT to reassess next treatment  Acute Rehab OT Goals Patient Stated Goal: to get stronger OT Goal Formulation: With patient Time For Goal Achievement: 12/19/17 Potential to Achieve Goals: Fair  Plan Discharge plan remains appropriate    Co-evaluation    PT/OT/SLP Co-Evaluation/Treatment: Yes Reason for Co-Treatment: Complexity of the patient's  impairments (multi-system involvement);To address functional/ADL transfers;For patient/therapist safety PT goals addressed during session: Mobility/safety with mobility OT goals addressed during session: ADL's and self-care      AM-PAC PT "6 Clicks" Daily Activity     Outcome Measure   Help from another person eating meals?: A Little Help from another person taking care of personal grooming?: A Little Help from another person toileting, which includes using toliet, bedpan, or urinal?: Total Help from another person bathing (including washing, rinsing, drying)?: A Lot Help from another person to put on and taking off regular upper body clothing?: A Lot Help from another person to put on and taking off regular lower body clothing?: Total 6 Click Score: 12    End of Session Equipment Utilized During Treatment: Oxygen;Other (comment)(lift equipment )  OT Visit Diagnosis: Unsteadiness on feet (R26.81);Pain;Muscle weakness (generalized) (M62.81) Pain - Right/Left: Left Pain - part of body: Shoulder   Activity Tolerance Patient limited by fatigue   Patient Left in bed;with call bell/phone within reach;with bed alarm set;with family/visitor present   Nurse Communication Mobility status        Time: [id number] OT Time Calculation (min): 23 min  Charges: OT General Charges $OT Visit: 1 Visit OT Treatments $Self Care/Home Management : 8-22 mins  Marcy SirenBreanna Karlie Aung, OT Pager [phone] 12/08/2017   Julie Bird 12/08/2017, 6:06 PM

## 2017-12-08 NOTE — Discharge Summary (Deleted)
[facility] Discharge Summary  Patient name: Julie Bird Medical record number: [id number] Date of birth: 24-Jul-1966 Age: 52 y.o. Gender: female Date of Admission: 12/01/2017  Date of Discharge: 12/09/2017 Admitting Physician: Kandice Hams, MD  Primary Care Provider: Guy Begin, FNP Consultants: CCM/Pulmonary, GI, General Surgery, Infectios Disease.   Indication for Hospitalization: Pancolitis, Diverticulitis with Microperforation  Discharge Diagnoses/Problem List:  Patient Active Problem List   Diagnosis Date Noted  . Noninfectious gastroenteritis   . Antineoplastic chemotherapy induced pancytopenia ([place])   . Acute lower UTI   . Anxiety and depression   . Benign essential HTN   . Chronic obstructive pulmonary disease ([place])   . Diastolic dysfunction   . Hypokalemia   . Hypothyroidism   . Pancytopenia ([place])   . Supplemental oxygen dependent   . Pressure injury of skin 12/02/2017  . Hyponatremia 12/02/2017  . Colitis 12/02/2017  . History of diverticulitis 12/02/2017  . Hepatic steatosis 12/02/2017  . Hypomagnesemia 12/02/2017  . Hypoalbuminemia 12/02/2017  . Elevated procalcitonin 12/02/2017  . Leukocytosis 12/02/2017  . Thrombocytopenia ([place]) 12/02/2017  . Normocytic anemia 12/02/2017  . Pyuria 12/02/2017  . Protein-calorie malnutrition, severe 12/02/2017  . Diverticulitis of colon with perforation 12/02/2017  . Hypotension 12/01/2017  . Dehydration   . Perforated diverticulum   . Pneumonia of right lower lobe due to infectious organism ([place])   . Failure to thrive in adult   . Diarrhea in adult patient   . Splenomegaly 09/28/2017   Disposition: Home w/ HH PT/OT  Discharge Condition: Improved  Discharge Exam:  Gen: morbidly obese, NAD, resting comfortably CV: RRR with no murmurs appreciated Pulm: 2L O2 [place], NWOB, crackles right lower lobe GI: large central pannis, Soft, Nontender,  MSK: no edema, cyanosis, or clubbing  noted Skin: warm, dry Extremity: 2+ lower extremity edema Neuro: grossly normal, moves all extremities Psych: Normal affect and thought content  Brief Hospital Course:  Julie Bird is a 52 y.o. female who presented with hypotensive requiring pressors in setting of chronic nausea and vomiting for 2 months. CT A/P was significant for diffuse colitis and small microperforation of 1.5 cm. Patient was also hypotensive in with SBP in 70s-80s resistant to multiple fluid boluses. Patient was started on neo to maintain pressors. GI was consulted and surgery for CT findings. Surgery did not see any indication for surgery given small size of perforation. Patient was started on Zosyn for antibiotic coverage. Patient was also found to have a RLL pneumonia on CXR. Patient was on 4L [place], but this was her home dose due to sever COPD. Patient also developed a E coli UTI confirmed on culture with multiple antibiotic resistances. Patient was transition to meropenum for antibiotic coverage for colitis, PNA, and UTI. Patient diarhea and nausea and vomiting. She was transitioned off of pressors and given stress dose steroids to maintain blood pressures (patient on long term prednisone for COPD). She tolerated this well and was started on a steroid taper for out patient. PO, N/V, and diarrhea gradually improved. Work up for colitis showed elevated fecal lactoferin, but normal C diff, Stool Biofire, and O&P. Repeat CT showed improved but persistent colitis and slight decrease in sized of microperforation. Patient was transitioned to oral levofloxacin and metronidazole, per recommendations from ID, to complete course of antibiotic coverage. PT/OT evaluated patient and recommended SNF placement, however, patient requested to go home. HH PT/OT, RN, HHA, provided to pt on discharge.   Antibiotic Course:  Zosyn ( 4/11-4/15) Meropenem 1  g q8 (4/15-4/18) Levofloxacin 500 POQD and Metronidazole 500 TID  (4/18-4/26)  Pancytopenia Patient had pancytopenia. Work up was initiated for this. Iron panel consistent with anemia of chronic disease, patient had low reticulocyte count. Path Smear reviewed reactive leukocytes and pancytopenia. HIV was NR. LDH, uric acid were normal. Likely pancytopenic in the setting of severe malnutrition.   Issues for Follow Up:  1. Ensure patient completes whole course of antibiotics 2. Consider bone marrow biopsy  3. GI recommended repeat colonoscopy after resolution of abdominal symptoms  Significant Procedures: None  Significant Labs and Imaging:  Recent Labs  Lab 12/07/17 0452 12/08/17 0523 12/08/17 0823  WBC 3.8* 3.5* 3.4*  HGB 7.7* 7.0* 7.4*  HCT 24.6* 23.0* 24.1*  PLT 114* 121* 114*   Recent Labs  Lab 12/02/17 0429  12/03/17 1109  12/05/17 0600 12/06/17 0930 12/07/17 0452 12/07/17 1447 12/08/17 0823  NA 130*   < > 130*   < > 135 135 136 136 135  K 4.2   < > 3.3*   < > 2.8* 2.9* 3.0* 3.9 3.4*  CL 97*   < > 99*   < > 100* 98* 100* 101 99*  CO2 21*   < > 22   < > 25 26 27 25 26   GLUCOSE 103*   < > 90   < > 93 136* 95 172* 113*  BUN 8   < > 5*   < > <5* <5* <5* <5* <5*  CREATININE 0.47   < > 0.48   < > 0.37* 0.46 0.40* 0.47 0.42*  CALCIUM 7.9*   < > 7.2*   < > 7.4* 8.2* 8.3* 8.1* 8.1*  MG 1.2*  --  1.6*  --  1.5*  --  1.8  --   --   PHOS 3.5  --   --   --   --   --   --   --   --   ALKPHOS 82  --   --   --  71  --   --   --   --   AST 20  --   --   --  19  --   --   --   --   ALT 12*  --   --   --  12*  --   --   --   --   ALBUMIN 2.5*  --   --   --  2.2*  --   --   --   --    < > = values in this interval not displayed.    Results/Tests Pending at Time of Discharge:  Unresulted Labs (From admission, onward)   Start     Ordered   12/08/17 0742  Osmolality, stool  Once,   R     12/08/17 0742   12/08/17 0741  Potassium, stool  Once,   R     12/08/17 0742   12/08/17 0741  Sodium, stool  Once,   R     12/08/17 0742   12/07/17 0500  CBC   Daily,   R    Question:  Specimen collection method  Answer:  Lab=Lab collect   12/06/17 1148     Ct Abdomen Pelvis W Contrast  Result Date: 12/01/2017 CLINICAL DATA:  Continued surveillance diverticulitis. Nausea, vomiting, and diarrhea for 4 months. EXAM: CT ABDOMEN AND PELVIS WITH CONTRAST TECHNIQUE: Multidetector CT imaging of the abdomen and pelvis was performed using the standard  protocol following bolus administration of intravenous contrast. CONTRAST:  132m ISOVUE-300 IOPAMIDOL (ISOVUE-300) INJECTION 61% COMPARISON:  09/28/2017. FINDINGS: Lower chest: RIGHT lower lobe infiltrate. This is a new finding from February. This affects primarily the posterior basal segment of the RIGHT lower lobe. No significant effusion. LEFT lung clear. Hepatobiliary: Steatosis. No focal liver abnormality. Cholecystectomy. Pancreas: Unremarkable. No pancreatic ductal dilatation or surrounding inflammatory changes. Low Spleen: Splenomegaly.  No focal lesions. Adrenals/Urinary Tract: Adrenal glands are unremarkable. Kidneys are normal, without renal calculi, focal lesion, or hydronephrosis. Bladder is unremarkable. Stomach/Bowel: Mild changes of descending/sigmoid colon colitis, with bowel wall thickening and reduction in luminal caliber. Mild mesenteric edema. Small 1.5 cm microperforation is seen at the proximal sigmoid region, series 3, image 80, more characteristic of diverticulitis. The ascending and transverse colon demonstrate bowel wall edema, new/increased from priors, raising the question of diffuse colitis. Infectious and inflammatory causes of colitis should be considered. No bowel obstruction. No free air or significant free fluid. Vascular/Lymphatic: Aortic atherosclerosis without enlarged abdominal or pelvic lymph nodes. Reproductive: Uterus and bilateral adnexa are unremarkable. Other: Small periumbilical paramedian hernia containing fat. Slight associated mesenteric edema. Musculoskeletal: No acute or  significant osseous findings. IMPRESSION: Mild increase in diffuse bowel wall thickening affecting not only the LEFT, but RIGHT and transverse colonic regions raising the question of diffuse colitis. Infectious and inflammatory causes should be considered. In the proximal descending region, there is a new microperforation, 1.5 cm in diameter, more characteristic of diverticulitis. Diverticulosis with diverticulitis was demonstrated on the previous scan, but may be mildly increased. Splenomegaly.  Hepatic steatosis. RIGHT lower lobe pneumonia, new from February. Electronically Signed   By: JStaci RighterM.D.   On: 12/01/2017 15:12   Dg Chest Port 1 View  Result Date: 12/01/2017 CLINICAL DATA:  Shortness of breath and cough EXAM: PORTABLE CHEST 1 VIEW COMPARISON:  09/28/2017 FINDINGS: The heart size and mediastinal contours are within normal limits. Both lungs are clear. The visualized skeletal structures are unremarkable. IMPRESSION: No active disease. Electronically Signed   By: MInez CatalinaM.D.   On: 12/01/2017 12:34    Discharge Medications:  Allergies as of 12/08/2017      Reactions   Codeine Shortness Of Breath   Lortab [hydrocodone-acetaminophen] Shortness Of Breath   Morphine And Related Shortness Of Breath      Medication List    STOP taking these medications   ciprofloxacin 500 MG tablet Commonly known as:  CIPRO   nystatin cream Commonly known as:  MYCOSTATIN   ranitidine 300 MG tablet Commonly known as:  ZANTAC     TAKE these medications   benzonatate 100 MG capsule Commonly known as:  TESSALON Take 100 mg by mouth 3 (three) times daily as needed for cough.   CLARITIN 10 MG tablet Generic drug:  loratadine Take 10 mg by mouth at bedtime.   clonazePAM 2 MG tablet Commonly known as:  KLONOPIN Take 2 mg by mouth 4 (four) times daily as needed for anxiety.   ferrous sulfate 324 (65 Fe) MG Tbec TAKE 1 TABLET BY MOUTH TWICE A DAY   fluticasone 50 MCG/ACT nasal  spray Commonly known as:  FLONASE Place 2 sprays into both nostrils daily.   folic acid 1 MG tablet Commonly known as:  FOLVITE Take 2 mg by mouth every morning.   furosemide 20 MG tablet Commonly known as:  LASIX Take 20 mg by mouth every morning.   levofloxacin 500 MG tablet Commonly known as:  LEVAQUIN Take 1  tablet (500 mg total) by mouth daily for 7 days. Start taking on:  12/09/2017   levothyroxine 75 MCG tablet Commonly known as:  SYNTHROID, LEVOTHROID Take 75 mcg by mouth daily.   metFORMIN 500 MG tablet Commonly known as:  GLUCOPHAGE Take 500 mg by mouth at bedtime as needed (for high BGL).   metroNIDAZOLE 500 MG tablet Commonly known as:  FLAGYL Take 1 tablet (500 mg total) by mouth 3 (three) times daily.   montelukast 10 MG tablet Commonly known as:  SINGULAIR Take 10 mg by mouth at bedtime.   multivitamin with minerals Tabs tablet Take 1 tablet by mouth daily. Start taking on:  12/09/2017   MUSCLE RUB MAXIMUM STRENGTH EX Apply to affected areas up to two times a day- left shoulder and both knees   nystatin 100000 UNIT/ML suspension Commonly known as:  MYCOSTATIN Take 5 mLs (500,000 Units total) by mouth 4 (four) times daily.   ondansetron 4 MG tablet Commonly known as:  ZOFRAN Take 1 tablet (4 mg total) by mouth daily as needed for nausea or vomiting.   pantoprazole 40 MG tablet Commonly known as:  PROTONIX Take 1 tablet (40 mg total) by mouth daily. Start taking on:  12/09/2017   potassium chloride 8 MEQ tablet Commonly known as:  KLOR-CON Take 24 mEq by mouth every morning.   pravastatin 80 MG tablet Commonly known as:  PRAVACHOL Take 80 mg by mouth at bedtime.   predniSONE 20 MG tablet Commonly known as:  DELTASONE Take 2 tablets (40 mg total) by mouth daily with breakfast for 3 days. Start taking on:  12/09/2017 What changed:  You were already taking a medication with the same name, and this prescription was added. Make sure you understand  how and when to take each.   predniSONE 10 MG tablet Commonly known as:  DELTASONE Take 3 tablets (30 mg total) by mouth daily with breakfast for 7 days. Start taking on:  12/13/2017 What changed:  You were already taking a medication with the same name, and this prescription was added. Make sure you understand how and when to take each.   predniSONE 20 MG tablet Commonly known as:  DELTASONE Take 1 tablet (20 mg total) by mouth daily with breakfast for 7 days. Start taking on:  12/20/2017 What changed:  You were already taking a medication with the same name, and this prescription was added. Make sure you understand how and when to take each.   predniSONE 10 MG tablet Commonly known as:  DELTASONE Take 1 tablet (10 mg total) by mouth daily with breakfast for 7 days. Start taking on:  12/27/2017 What changed:  You were already taking a medication with the same name, and this prescription was added. Make sure you understand how and when to take each.   predniSONE 5 MG tablet Commonly known as:  DELTASONE Take 1 tablet (5 mg total) by mouth every morning. Start taking on:  01/04/2018 What changed:  These instructions start on 01/04/2018. If you are unsure what to do until then, ask your doctor or other care provider.   albuterol (2.5 MG/3ML) 0.083% nebulizer solution Commonly known as:  PROVENTIL Take 2.5 mg by nebulization 2 (two) times daily as needed for wheezing or shortness of breath.   PROAIR HFA 108 (90 Base) MCG/ACT inhaler Generic drug:  albuterol Inhale 1-2 puffs into the lungs every 4 (four) hours as needed for wheezing or shortness of breath.   promethazine 25 MG tablet Commonly known  as:  PHENERGAN Take 25 mg by mouth every 8 (eight) hours as needed for nausea or vomiting.   protein supplement shake Liqd Commonly known as:  PREMIER PROTEIN Take 325 mLs (11 oz total) by mouth 3 (three) times daily between meals.   risperidone 4 MG tablet Commonly known as:   RISPERDAL Take 4 mg by mouth at bedtime.   roflumilast 500 MCG Tabs tablet Commonly known as:  DALIRESP Take 1 tablet (500 mcg total) by mouth daily. Start taking on:  12/09/2017 What changed:  how much to take   sertraline 100 MG tablet Commonly known as:  ZOLOFT Take 100 mg by mouth 2 (two) times daily.   therapeutic multivitamin-minerals tablet Take 1 tablet by mouth daily.   Vitamin D (Ergocalciferol) 50000 units Caps capsule Commonly known as:  DRISDOL Take 1 capsule (50,000 Units total) by mouth every Monday, Wednesday, and Friday. Start taking on:  12/09/2017   Vitamin D3 50000 units Caps Take 1 capsule by mouth every Monday, Wednesday, and Friday.   WIXELA INHUB 500-50 MCG/DOSE Aepb Generic drug:  Fluticasone-Salmeterol Inhale 1 puff into the lungs 2 (two) times daily.       Discharge Instructions: Please refer to Patient Instructions section of EMR for full details.  Patient was counseled important signs and symptoms that should prompt return to medical care, changes in medications, dietary instructions, activity restrictions, and follow up appointments.   Follow-Up Appointments: Follow-up Information    Jackquline Denmark, MD Follow up on 01/06/2018.   Specialties:  Gastroenterology, Internal Medicine Why:  10:30 AM the above address in incorrect.  you will recieve letter in mail with appointment details and correct address which is on [address] in [place] [employer] information: [place] [address] [phone]        Guy Begin, [place]. Schedule an appointment as soon as possible for a visit in 1 week(s).   Specialty:  Family Medicine Contact information: [place] [address] [phone]        Gevena Barre, MD .   Specialty:  Cardiology Contact information: [address] [place] [address] [phone]           Bonnita Hollow, MD 12/08/2017, 4:16 PM PGY-1, [place]

## 2017-12-08 NOTE — Progress Notes (Signed)
ED evening CSW received phone call concerning pt's [facility] transport home. [facility] [place], it [place] the 50 miles covered by insurance. [facility] representative stated he would call supervisor to make sure [facility] is unable to transport that far. Waiting for call back.    Plan: If confirmed unable to transport, follow up with [facility] leadership on 4/19 for clarification about distance able to transport. Previous notes indicate [facility] accepted transport knowing the distance to pt's home. Re-assess possibility of pt going home with family members.   Montine CircleKelsy Leeona Mccardle, Silverio LayLCSWA [facility]  [phone]

## 2017-12-08 NOTE — Care Management Note (Addendum)
Case Management Note  Patient Details  Name: Julie Bird MRN: [id number] Date of Birth: [birth year]  Subjective/Objective:                    Action/Plan:  Discussed home health agency again with patient and husband at bedside. They would like  [facility] . Same not on list of choice for patient's address, called confirmed [facility] does not cover [place]. Explained to patient and family and provided another list of home health agencies for their zip code. They have no preference "who covers address and takes Medicaid". [facility] [place] [facility] phone [phone] fax [phone]. Faxed face sheet , joanne will check insurance and call NCM back.   Randa EvensJoanne with [facility] has accepted referral. Family aware .  Expected Discharge Date:  12/08/17               Expected Discharge Plan:  Home w Home Health Services  In-House Referral:     Discharge planning Services  CM Consult  Post Acute Care Choice:  Home Health Choice offered to:  Patient, Spouse, Adult Children  DME Arranged:  N/A DME Agency:  NA  HH Arranged:  PT, OT, Nurse's Aide HH Agency:     Status of Service:  In process, will continue to follow  If discussed at Long Length of Stay Meetings, dates discussed:    Additional Comments:  Kingsley PlanWile, Atiya Yera Marie, RN 12/08/2017, 3:05 PM

## 2017-12-09 LAB — CBC
HCT: 25.9 % — ABNORMAL LOW (ref 36.0–46.0)
Hemoglobin: 7.9 g/dL — ABNORMAL LOW (ref 12.0–15.0)
MCH: 27.4 pg (ref 26.0–34.0)
MCHC: 30.5 g/dL (ref 30.0–36.0)
MCV: 89.9 fL (ref 78.0–100.0)
Platelets: 115 K/uL — ABNORMAL LOW (ref 150–400)
RBC: 2.88 MIL/uL — ABNORMAL LOW (ref 3.87–5.11)
RDW: 16 % — ABNORMAL HIGH (ref 11.5–15.5)
WBC: 4 K/uL (ref 4.0–10.5)

## 2017-12-09 LAB — GLUCOSE, CAPILLARY: GLUCOSE-CAPILLARY: 125 mg/dL — AB (ref 65–99)

## 2017-12-09 LAB — OSMOLALITY, STOOL

## 2017-12-09 NOTE — Care Management (Signed)
Case manager received call from [facility] explaining that start of Care for Home Health will not be until Monday, December 12, 2017, requested documentation from MD that this will be acceptable. CM contacted Dr.A. Janee Mornhompson who will place note confirming start of care date. CM will fax to [phone].

## 2017-12-09 NOTE — Progress Notes (Signed)
Pts' Husband called and informed about transportation status. Agreed for pt staying another night, and to resolved transport issues in AM. On call Md notified and cancelled  D/C order for today.

## 2017-12-09 NOTE — Discharge Summary (Signed)
[facility] Discharge Summary  Patient name: Julie Bird Medical record number: [id number] Date of birth: 1966/01/26 Age: 52 y.o. Gender: female Date of Admission: 12/01/2017  Date of Discharge: 12/10/2017 Admitting Physician: Julie Hams, MD  Primary Care Provider: Guy Begin, FNP Consultants: CCM/Pulmonary, GI, General Surgery, Infectios Disease.   Indication for Hospitalization: Pancolitis, Diverticulitis with Microperforation  Discharge Diagnoses/Problem List:  Patient Active Problem List   Diagnosis Date Noted  . Noninfectious gastroenteritis   . Antineoplastic chemotherapy induced pancytopenia ([place])   . Acute lower UTI   . Anxiety and depression   . Benign essential HTN   . Chronic obstructive pulmonary disease ([place])   . Diastolic dysfunction   . Hypokalemia   . Hypothyroidism   . Pancytopenia ([place])   . Supplemental oxygen dependent   . Pressure injury of skin 12/02/2017  . Hyponatremia 12/02/2017  . Colitis 12/02/2017  . History of diverticulitis 12/02/2017  . Hepatic steatosis 12/02/2017  . Hypomagnesemia 12/02/2017  . Hypoalbuminemia 12/02/2017  . Elevated procalcitonin 12/02/2017  . Leukocytosis 12/02/2017  . Thrombocytopenia ([place]) 12/02/2017  . Normocytic anemia 12/02/2017  . Pyuria 12/02/2017  . Protein-calorie malnutrition, severe 12/02/2017  . Diverticulitis of colon with perforation 12/02/2017  . Hypotension 12/01/2017  . Dehydration   . Perforated diverticulum   . Pneumonia of right lower lobe due to infectious organism ([place])   . Failure to thrive in adult   . Diarrhea in adult patient   . Splenomegaly 09/28/2017   Disposition: Home w/ HH PT/OT  Discharge Condition: Improved  Discharge Exam:  Gen: morbidly obese, NAD, resting comfortably CV: RRR with no murmurs appreciated Pulm: 3L O2 [place], NWOB, crackles right lower lobe GI: large central pannis, Soft, Nontender,  MSK: no edema, cyanosis, or clubbing  noted Skin: warm, dry Extremity: 2+ lower extremity edema Neuro: grossly normal, moves all extremities Psych: Normal affect and thought content  Brief Hospital Course:  Julie Bird is a 52 y.o. female who presented with hypotensive requiring pressors in setting of chronic nausea and vomiting for 2 months. CT A/P was significant for diffuse colitis and small microperforation of 1.5 cm. Patient was also hypotensive in with SBP in 70s-80s resistant to multiple fluid boluses. Patient was started on neo to maintain pressors. GI was consulted and surgery for CT findings. Surgery did not see any indication for surgery given small size of perforation. Patient was started on Zosyn for antibiotic coverage. Patient was also found to have a RLL pneumonia on CXR. Patient was on 4L [place], but this was her home dose due to sever COPD. Patient also developed a E coli UTI confirmed on culture with multiple antibiotic resistances. Patient was transition to meropenum for antibiotic coverage for colitis, PNA, and UTI. Patient diarhea and nausea and vomiting. She was transitioned off of pressors and given stress dose steroids to maintain blood pressures (patient on long term prednisone for COPD). She tolerated this well and was started on a steroid taper for out patient. PO, N/V, and diarrhea gradually improved. Work up for colitis showed elevated fecal lactoferin, but normal C diff, Stool Biofire, and O&P. Repeat CT showed improved but persistent colitis and slight decrease in sized of microperforation. Patient was transitioned to oral levofloxacin and metronidazole, per recommendations from ID, to complete course of antibiotic coverage. PT/OT evaluated patient and recommended SNF placement, however, patient requested to go home. Risk and benefits of SNF vs. Returning home were discussed with patient. Patient still verbalized that she wished to  return home. HH PT/OT, RN, HHA, provided to pt on discharge.   Antibiotic Course:   Zosyn ( 4/11-4/15) Meropenem 1 g q8 (4/15-4/18) Levofloxacin 500 POQD and Metronidazole 500 TID (4/18-4/26)  Pancytopenia Patient had pancytopenia. Work up was initiated for this. Iron panel consistent with anemia of chronic disease, patient had low reticulocyte count. Path Smear reviewed reactive leukocytes and pancytopenia. HIV was NR. LDH, uric acid were normal. Likely pancytopenic in the setting of severe malnutrition.   Issues for Follow Up:  1. Ensure patient completes whole course of antibiotics 2. Consider bone marrow biopsy  3. GI recommended repeat colonoscopy after resolution of abdominal symptoms  Significant Procedures: None  Significant Labs and Imaging:  Recent Labs  Lab 12/08/17 0523 12/08/17 0823 12/09/17 0542  WBC 3.5* 3.4* 4.0  HGB 7.0* 7.4* 7.9*  HCT 23.0* 24.1* 25.9*  PLT 121* 114* 115*   Recent Labs  Lab 12/03/17 1109  12/05/17 0600 12/06/17 0930 12/07/17 0452 12/07/17 1447 12/08/17 0823  NA 130*   < > 135 135 136 136 135  K 3.3*   < > 2.8* 2.9* 3.0* 3.9 3.4*  CL 99*   < > 100* 98* 100* 101 99*  CO2 22   < > _0 GLUCOSE 90   < > 93 136* 95 172* 113*  BUN 5*   < > <5* <5* <5* <5* <5*  CREATININE 0.48   < > 0.37* 0.46 0.40* 0.47 0.42*  CALCIUM 7.2*   < > 7.4* 8.2* 8.3* 8.1* 8.1*  MG 1.6*  --  1.5*  --  1.8  --   --   ALKPHOS  --   --  71  --   --   --   --   AST  --   --  19  --   --   --   --   ALT  --   --  12*  --   --   --   --   ALBUMIN  --   --  2.2*  --   --   --   --    < > = values in this interval not displayed.    Results/Tests Pending at Time of Discharge:  Unresulted Labs (From admission, onward)   Start     Ordered   12/08/17 0742  Osmolality, stool  Once,   R     12/08/17 0742   12/08/17 0741  Potassium, stool  Once,   R     12/08/17 0742   12/08/17 0741  Sodium, stool  Once,   R     12/08/17 0742     Ct Abdomen Pelvis W Contrast  Result Date: 12/01/2017 CLINICAL DATA:  Continued surveillance  diverticulitis. Nausea, vomiting, and diarrhea for 4 months. EXAM: CT ABDOMEN AND PELVIS WITH CONTRAST TECHNIQUE: Multidetector CT imaging of the abdomen and pelvis was performed using the standard protocol following bolus administration of intravenous contrast. CONTRAST:  135m ISOVUE-300 IOPAMIDOL (ISOVUE-300) INJECTION 61% COMPARISON:  09/28/2017. FINDINGS: Lower chest: RIGHT lower lobe infiltrate. This is a new finding from February. This affects primarily the posterior basal segment of the RIGHT lower lobe. No significant effusion. LEFT lung clear. Hepatobiliary: Steatosis. No focal liver abnormality. Cholecystectomy. Pancreas: Unremarkable. No pancreatic ductal dilatation or surrounding inflammatory changes. Low Spleen: Splenomegaly.  No focal lesions. Adrenals/Urinary Tract: Adrenal glands are unremarkable. Kidneys are normal, without renal calculi, focal lesion, or hydronephrosis. Bladder is unremarkable. Stomach/Bowel: Mild changes of  descending/sigmoid colon colitis, with bowel wall thickening and reduction in luminal caliber. Mild mesenteric edema. Small 1.5 cm microperforation is seen at the proximal sigmoid region, series 3, image 80, more characteristic of diverticulitis. The ascending and transverse colon demonstrate bowel wall edema, new/increased from priors, raising the question of diffuse colitis. Infectious and inflammatory causes of colitis should be considered. No bowel obstruction. No free air or significant free fluid. Vascular/Lymphatic: Aortic atherosclerosis without enlarged abdominal or pelvic lymph nodes. Reproductive: Uterus and bilateral adnexa are unremarkable. Other: Small periumbilical paramedian hernia containing fat. Slight associated mesenteric edema. Musculoskeletal: No acute or significant osseous findings. IMPRESSION: Mild increase in diffuse bowel wall thickening affecting not only the LEFT, but RIGHT and transverse colonic regions raising the question of diffuse colitis.  Infectious and inflammatory causes should be considered. In the proximal descending region, there is a new microperforation, 1.5 cm in diameter, more characteristic of diverticulitis. Diverticulosis with diverticulitis was demonstrated on the previous scan, but may be mildly increased. Splenomegaly.  Hepatic steatosis. RIGHT lower lobe pneumonia, new from February. Electronically Signed   By: Staci Righter M.D.   On: 12/01/2017 15:12   Dg Chest Port 1 View  Result Date: 12/01/2017 CLINICAL DATA:  Shortness of breath and cough EXAM: PORTABLE CHEST 1 VIEW COMPARISON:  09/28/2017 FINDINGS: The heart size and mediastinal contours are within normal limits. Both lungs are clear. The visualized skeletal structures are unremarkable. IMPRESSION: No active disease. Electronically Signed   By: Inez Catalina M.D.   On: 12/01/2017 12:34    Discharge Medications:  Allergies as of 12/09/2017      Reactions   Codeine Shortness Of Breath   Lortab [hydrocodone-acetaminophen] Shortness Of Breath   Morphine And Related Shortness Of Breath      Medication List    STOP taking these medications   ciprofloxacin 500 MG tablet Commonly known as:  CIPRO   nystatin cream Commonly known as:  MYCOSTATIN   ranitidine 300 MG tablet Commonly known as:  ZANTAC     TAKE these medications   benzonatate 100 MG capsule Commonly known as:  TESSALON Take 100 mg by mouth 3 (three) times daily as needed for cough.   CLARITIN 10 MG tablet Generic drug:  loratadine Take 10 mg by mouth at bedtime.   clonazePAM 2 MG tablet Commonly known as:  KLONOPIN Take 2 mg by mouth 4 (four) times daily as needed for anxiety.   ferrous sulfate 324 (65 Fe) MG Tbec TAKE 1 TABLET BY MOUTH TWICE A DAY   fluticasone 50 MCG/ACT nasal spray Commonly known as:  FLONASE Place 2 sprays into both nostrils daily.   folic acid 1 MG tablet Commonly known as:  FOLVITE Take 2 mg by mouth every morning.   furosemide 20 MG tablet Commonly  known as:  LASIX Take 20 mg by mouth every morning.   levofloxacin 500 MG tablet Commonly known as:  LEVAQUIN Take 1 tablet (500 mg total) by mouth daily for 7 days.   levothyroxine 75 MCG tablet Commonly known as:  SYNTHROID, LEVOTHROID Take 75 mcg by mouth daily.   metFORMIN 500 MG tablet Commonly known as:  GLUCOPHAGE Take 500 mg by mouth at bedtime as needed (for high BGL).   metroNIDAZOLE 500 MG tablet Commonly known as:  FLAGYL Take 1 tablet (500 mg total) by mouth 3 (three) times daily.   montelukast 10 MG tablet Commonly known as:  SINGULAIR Take 10 mg by mouth at bedtime.   multivitamin with minerals  Tabs tablet Take 1 tablet by mouth daily.   MUSCLE RUB MAXIMUM STRENGTH EX Apply to affected areas up to two times a day- left shoulder and both knees   nystatin 100000 UNIT/ML suspension Commonly known as:  MYCOSTATIN Take 5 mLs (500,000 Units total) by mouth 4 (four) times daily.   ondansetron 4 MG tablet Commonly known as:  ZOFRAN Take 1 tablet (4 mg total) by mouth daily as needed for nausea or vomiting.   pantoprazole 40 MG tablet Commonly known as:  PROTONIX Take 1 tablet (40 mg total) by mouth daily.   potassium chloride 8 MEQ tablet Commonly known as:  KLOR-CON Take 24 mEq by mouth every morning.   pravastatin 80 MG tablet Commonly known as:  PRAVACHOL Take 80 mg by mouth at bedtime.   predniSONE 20 MG tablet Commonly known as:  DELTASONE Take 2 tablets (40 mg total) by mouth daily with breakfast for 3 days. What changed:  You were already taking a medication with the same name, and this prescription was added. Make sure you understand how and when to take each.   predniSONE 10 MG tablet Commonly known as:  DELTASONE Take 3 tablets (30 mg total) by mouth daily with breakfast for 7 days. Start taking on:  12/13/2017 What changed:  You were already taking a medication with the same name, and this prescription was added. Make sure you understand how  and when to take each.   predniSONE 20 MG tablet Commonly known as:  DELTASONE Take 1 tablet (20 mg total) by mouth daily with breakfast for 7 days. Start taking on:  12/20/2017 What changed:  You were already taking a medication with the same name, and this prescription was added. Make sure you understand how and when to take each.   predniSONE 10 MG tablet Commonly known as:  DELTASONE Take 1 tablet (10 mg total) by mouth daily with breakfast for 7 days. Start taking on:  12/27/2017 What changed:  You were already taking a medication with the same name, and this prescription was added. Make sure you understand how and when to take each.   predniSONE 5 MG tablet Commonly known as:  DELTASONE Take 1 tablet (5 mg total) by mouth every morning. Start taking on:  01/04/2018 What changed:  These instructions start on 01/04/2018. If you are unsure what to do until then, ask your doctor or other care provider.   albuterol (2.5 MG/3ML) 0.083% nebulizer solution Commonly known as:  PROVENTIL Take 2.5 mg by nebulization 2 (two) times daily as needed for wheezing or shortness of breath.   PROAIR HFA 108 (90 Base) MCG/ACT inhaler Generic drug:  albuterol Inhale 1-2 puffs into the lungs every 4 (four) hours as needed for wheezing or shortness of breath.   promethazine 25 MG tablet Commonly known as:  PHENERGAN Take 25 mg by mouth every 8 (eight) hours as needed for nausea or vomiting.   protein supplement shake Liqd Commonly known as:  PREMIER PROTEIN Take 325 mLs (11 oz total) by mouth 3 (three) times daily between meals.   risperidone 4 MG tablet Commonly known as:  RISPERDAL Take 4 mg by mouth at bedtime.   roflumilast 500 MCG Tabs tablet Commonly known as:  DALIRESP Take 1 tablet (500 mcg total) by mouth daily. What changed:  how much to take   sertraline 100 MG tablet Commonly known as:  ZOLOFT Take 100 mg by mouth 2 (two) times daily.   therapeutic multivitamin-minerals  tablet Take  1 tablet by mouth daily.   Vitamin D (Ergocalciferol) 50000 units Caps capsule Commonly known as:  DRISDOL Take 1 capsule (50,000 Units total) by mouth every Monday, Wednesday, and Friday.   Vitamin D3 50000 units Caps Take 1 capsule by mouth every Monday, Wednesday, and Friday.   WIXELA INHUB 500-50 MCG/DOSE Aepb Generic drug:  Fluticasone-Salmeterol Inhale 1 puff into the lungs 2 (two) times daily.            Durable Medical Equipment  (From admission, onward)        Start     Ordered   12/08/17 1912  For home use only DME wheelchair cushion (seat and back)  Once     12/08/17 1911   12/08/17 1911  For home use only DME 3 n 1  Once     12/08/17 1911   12/08/17 1733  For home use only DME Other see comment  Once    Comments:  Bariatric sliding board   12/08/17 1732   12/08/17 1728  For home use only DME standard manual wheelchair with seat cushion  Once    Comments:  Needs BARIATRIC wheelchair Patient suffers from generalized weakness which impairs their ability to perform daily activities like bathing, dressing and toileting in the home.  A wheelchair will allow patient to safely perform daily activities. Patient can safely propel the wheelchair in the home or has a caregiver who can provide assistance.  Accessories: elevating leg rests (ELRs), wheel locks, extensions and anti-tippers.   12/08/17 1732      Discharge Instructions: Please refer to Patient Instructions section of EMR for full details.  Patient was counseled important signs and symptoms that should prompt return to medical care, changes in medications, dietary instructions, activity restrictions, and follow up appointments.   Follow-Up Appointments: Follow-up Information    Jackquline Denmark, MD Follow up on 01/06/2018.   Specialties:  Gastroenterology, Internal Medicine Why:  10:30 AM the above address in incorrect.  you will recieve letter in mail with appointment details and correct address which  is on [address] in [place] [employer] information: [place] [address] [phone]        Julie Bird, [place]. Schedule an appointment as soon as possible for a visit in 1 week(s).   Specialty:  Family Medicine Contact information: [place] [address] [phone]        Gevena Barre, MD .   Specialty:  Cardiology Contact information: [address] [place] [address] [phone]           Bonnita Hollow, MD 12/09/2017, 9:24 AM PGY-1, [place]

## 2017-12-09 NOTE — Progress Notes (Signed)
Pt is discharged to go home with home health.  Discharge instructions and prescriptions information give.  Pt states understanding of the education and all questions were answered.

## 2017-12-11 LAB — POTASSIUM, STOOL: Potassium, Stl: 166 mmol/L

## 2017-12-11 LAB — SODIUM, STOOL

## 2017-12-14 ENCOUNTER — Telehealth: Payer: Self-pay | Admitting: Nurse Practitioner

## 2017-12-14 NOTE — Telephone Encounter (Signed)
Home Oxygen and Medical Equipment:  Dr Pollie MeyerMcintyre wrote orders for a wheel chair cushion and bariatric sliding order.  Julie Bird needs a  Rx for a wheelchair and a deluxe transfer board. Also needs RX with a diagnosis code, ICD 10, notes and/or face to face.  Rx needed before going to the next step.  Fax  [phone] Dr Pollie MeyerMcIntyre saw pt in the hospital

## 2017-12-14 NOTE — Telephone Encounter (Signed)
I didn't write these orders, as was not attending at time of discharge. Forwarding to Dr. Leveda AnnaHensel as I'm not sure of the details of all that she needed.  Latrelle DodrillBrittany J McIntyre, MD

## 2017-12-15 NOTE — Telephone Encounter (Signed)
Rx and notes (discharge summary) faxed as requested. Litzy Dicker, Maryjo RochesterJessica Dawn, CMA

## 2017-12-15 NOTE — Telephone Encounter (Signed)
Order written as requested on Rx pad.  Will give to nurse to fax.

## 2017-12-29 ENCOUNTER — Other Ambulatory Visit: Payer: Self-pay | Admitting: Family Medicine

## 2018-01-06 ENCOUNTER — Ambulatory Visit: Payer: Medicaid - Out of State | Admitting: Gastroenterology

## 2018-01-09 ENCOUNTER — Telehealth: Payer: Self-pay | Admitting: Family Medicine

## 2018-01-09 NOTE — Telephone Encounter (Signed)
Joann from [facility] called to check on the status of the pt's paperwork. Pt was seen by Dr Pollie Meyer at the hospital and she placed Pt referral. [facility] faxed a Start of care order, plan of care and a discharge order to be signed. She said to please fax to [phone]. If there are any questions to please call Joann at [phone]

## 2018-01-11 NOTE — Telephone Encounter (Signed)
Joann called again checking on the status of Julie Bird's paperwork. The orders faxed to [place] were put in [name] box and are no longer there. Pt has passed away 2023/01/01 14-Jan-2018. Joann needs orders faxed ASAP.

## 2018-01-12 NOTE — Telephone Encounter (Signed)
Orders signed, will place in "to fax" box. Latrelle Dodrill, MD

## 2018-01-21 DEATH — deceased

## 2019-10-27 IMAGING — CT CT ABD-PELV W/ CM
2 of 5 series · 14 of 46 positions shown, 16 images · IV contrast (iopamidol)
Comparison: December 01, 2017

CLINICAL DATA: Recent diverticulitis

EXAM:
CT ABDOMEN AND PELVIS WITH CONTRAST
TECHNIQUE: Multidetector CT imaging of the abdomen and pelvis was performed
using the standard protocol following bolus administration of
intravenous contrast. Oral contrast was also administered.
CONTRAST:  100mL 6RZNHX-BJJ IOPAMIDOL (6RZNHX-BJJ) INJECTION 61%

[Series 3: abdomen 5.0 · axial · 0.77mm/px · z∈[+1196,+1631]mm · 11 of 99 slices shown, 13 images]
[im 6/99  soft-tissue]
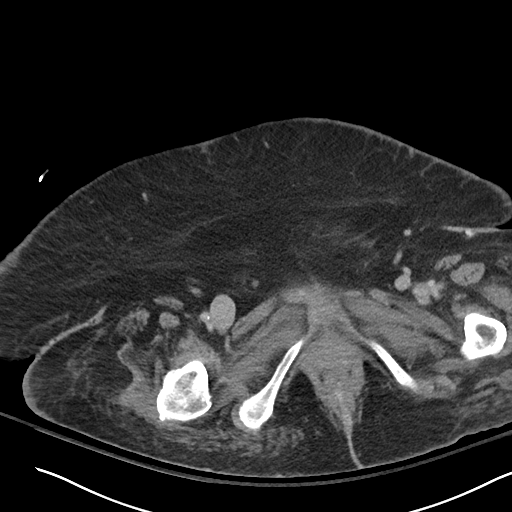
[im 6/99  bone]
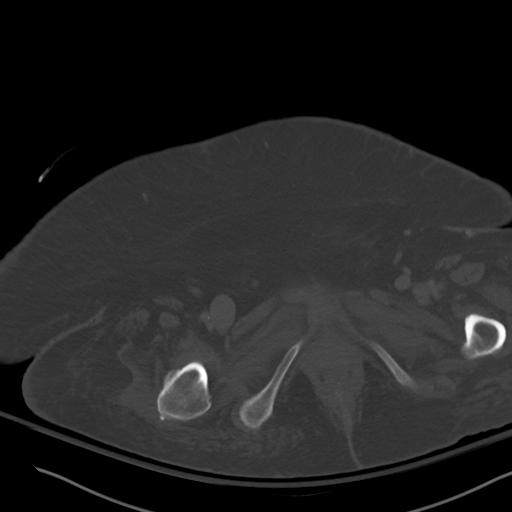
[im 18/99  soft-tissue]
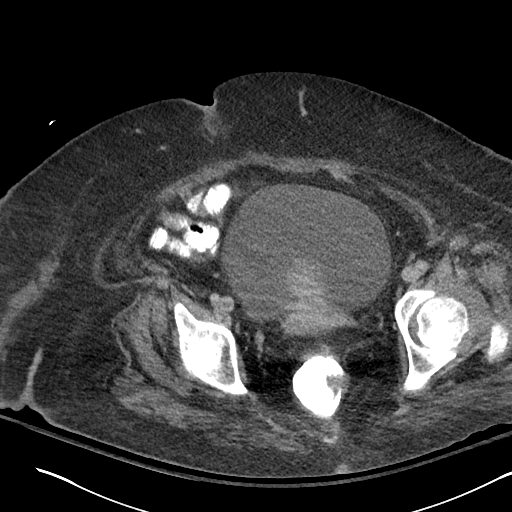
[im 24/99  soft-tissue]
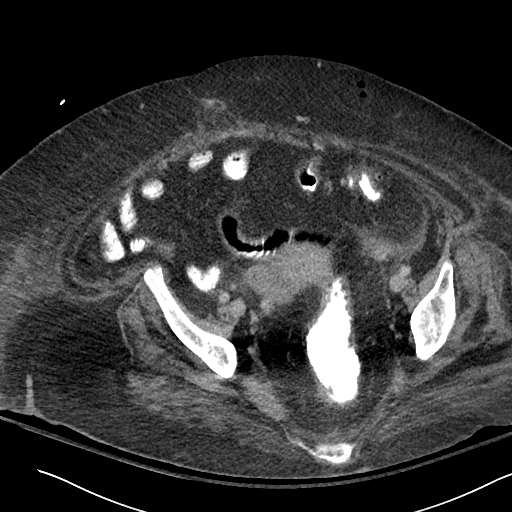
[im 35/99  soft-tissue]
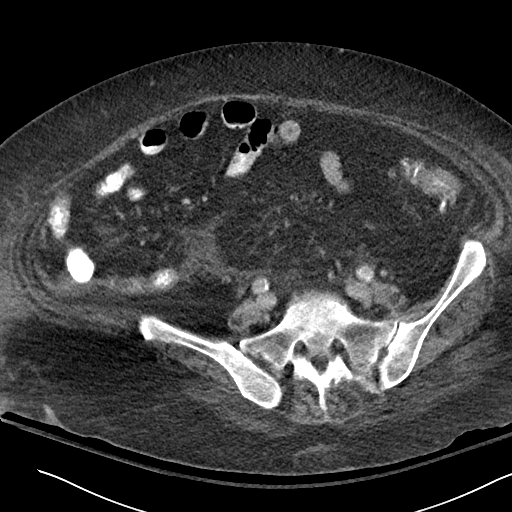
[im 41/99  soft-tissue]
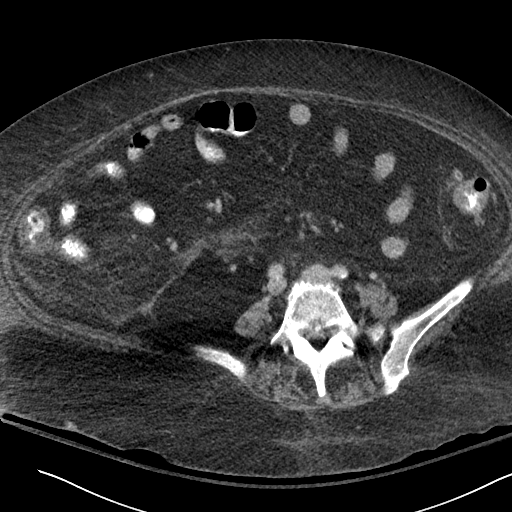
[im 52/99  soft-tissue]
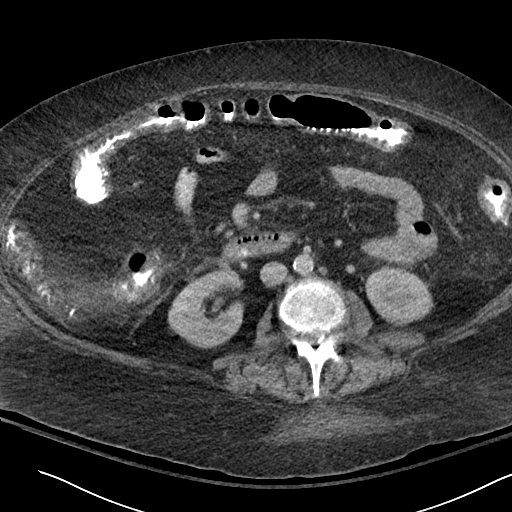
[im 58/99  soft-tissue]
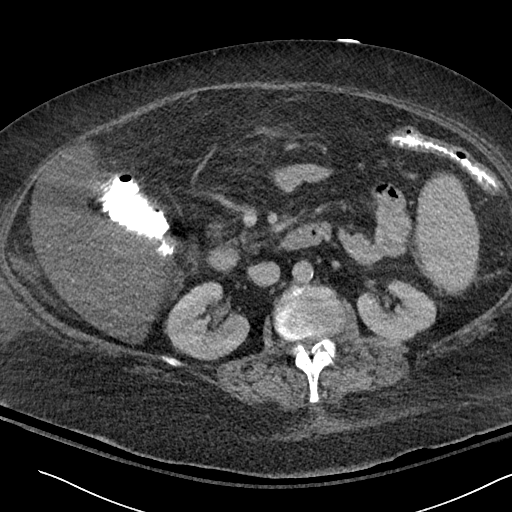
[im 64/99  soft-tissue]
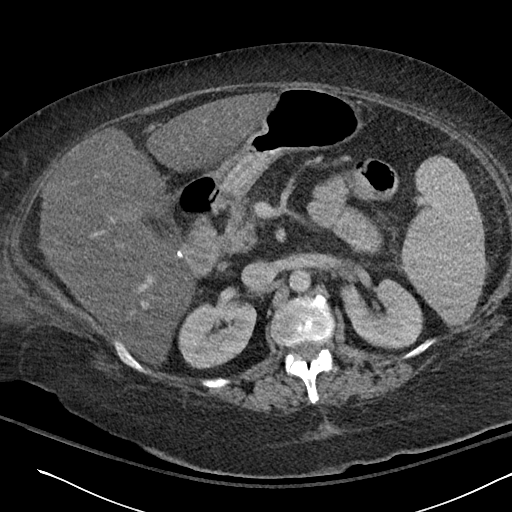
[im 75/99  soft-tissue]
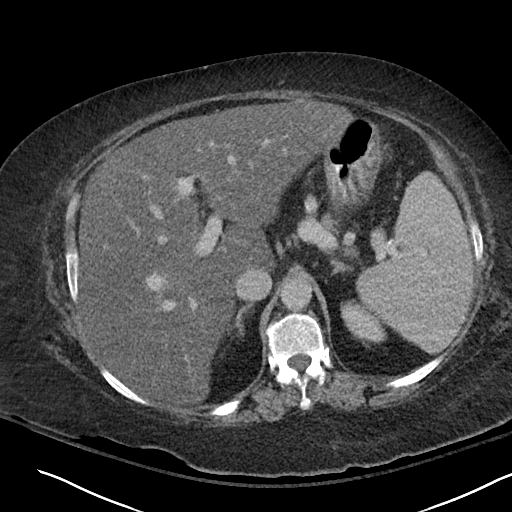
[im 75/99  bone]
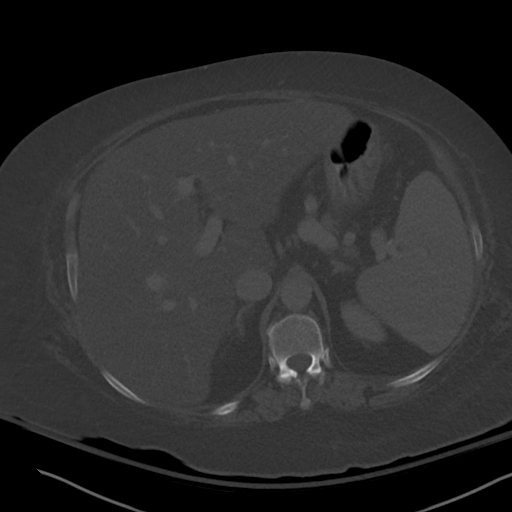
[im 81/99  soft-tissue]
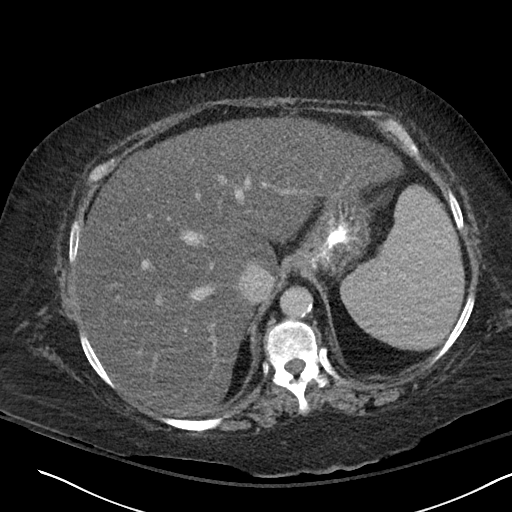
[im 93/99  soft-tissue]
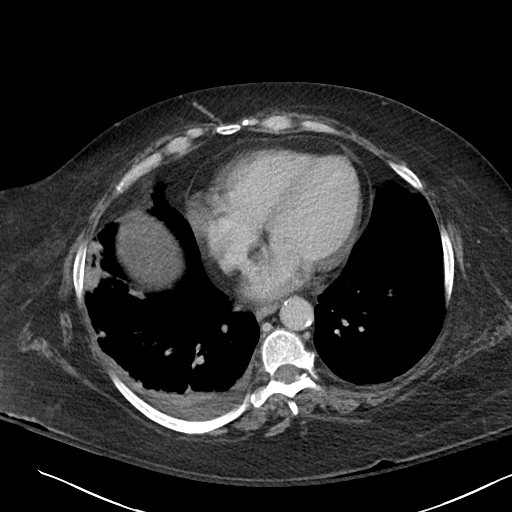

[Series 6: abdomen 3.0 mpr cor · coronal · 0.97mm/px · 3 of 101 slices shown]
[im 34/101  soft-tissue]
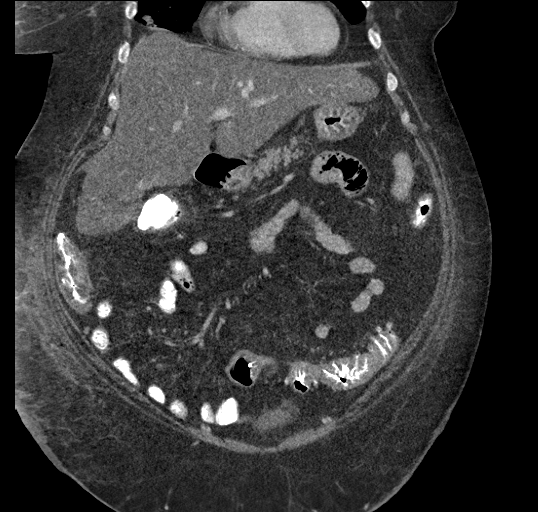
[im 45/101  soft-tissue]
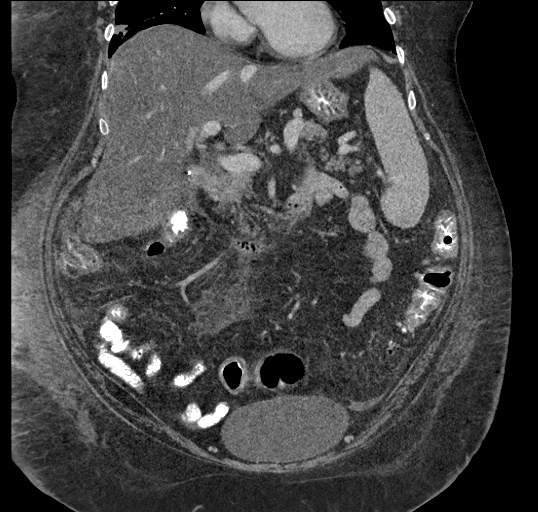
[im 56/101  soft-tissue]
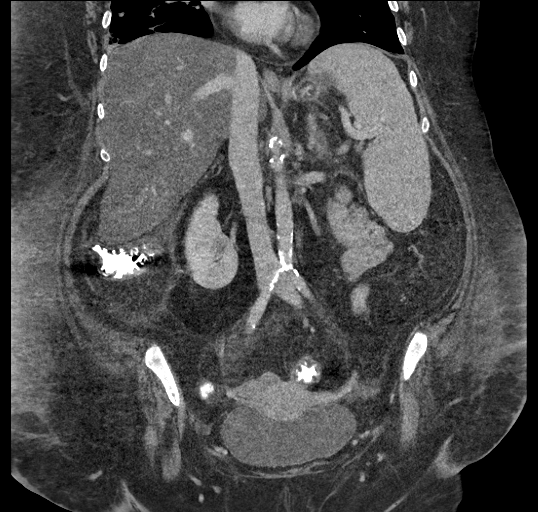

[14 of 46 positions shown; findings below may reference images not displayed]

FINDINGS: Lower chest: There is infiltrate in the right middle lobe, not
present on most recent study. There is also consolidation with
pleural effusion in the right lower lobe, increased from recent
study. There is a new small left pleural effusion with left base
atelectasis.

Hepatobiliary: Liver measures 22.4 cm in length. There is diffuse
hepatic steatosis. No focal liver lesions are evident. Gallbladder
is absent. There is no biliary duct dilatation.

Pancreas: No pancreatic mass or inflammatory focus.

Spleen: Spleen measures 18.9 x 13.4 x 9.2 cm with a measured splenic
volume of 1,165 cubic cm. No focal splenic lesions are evident.

Adrenals/Urinary Tract: Adrenals bilaterally appear normal. Kidneys
bilaterally show no evident mass or hydronephrosis. No renal or
ureteral calculus evident. Urinary bladder is midline with wall
thickness within normal limits.

Stomach/Bowel: There is extensive wall thickening throughout
essentially the entire right colon as well as portions of the
proximal transverse colon. There is wall thickening throughout much
of the descending colon and sigmoid colon as well. Note that there
are irregular diverticula again noted in the distal descending and
sigmoid colon regions. No well-defined abscess is seen. The area of
perforation at the junction of the descending and sigmoid colon is
again noted with oral contrast extending through the wall into this
area. No air is seen in this area. This area is no larger than on
the previous study and may be some slightly smaller at this time. No
small bowel lesions are evident. No small bowel obstruction. There
is persistent mesenteric stranding and thickening in the right upper
pelvis. There is slightly more ill-defined fluid in this area
consistent with loculated ascites. No free air or portal venous air
is evident.

Vascular/Lymphatic: There is aortic and iliac artery atherosclerosis
without evident aneurysm. Major mesenteric vessels appear patent. No
adenopathy is appreciable in the abdomen or pelvis.

Reproductive: Uterus is anteverted. No uterine or ovarian mass
evident.

Other: Appendix appears unremarkable. No abscess is evident in the
abdomen or pelvis. There is a small ventral hernia containing only
fat.

There is edema throughout the posterior and lateral abdominal walls.
A lesser degree of edema is noted in the anterior abdominal wall. A
small amount of air is noted in the anterior abdominal wall, likely
due to injection.

Musculoskeletal: There is degenerative change in the lumbar spine.
No blastic or lytic bone lesions noted. No intramuscular lesions are
demonstrated.
IMPRESSION: 1. Extensive changes of colitis involving the right colon and
proximal transverse colon. Colitis with apparent superimposed
diverticulitis throughout much of the left colon and sigmoid colon.
Small perforation near the junction of the descending and sigmoid
colon is again noted, marginally smaller. No abscess evident. There
is an increase in inflammation in the right upper pelvis with mild
loculated ascites in this area, a finding not present previously. No
small bowel inflammation evident.

2. No well-defined abscess in the abdomen or pelvis. No bowel
obstruction. Appendix region appears normal.

3. Areas of pneumonia in the right middle and lower lobes with
increase in consolidation in the right lower lobe and new
consolidation in portions of the right middle lobe compared to
recent prior study. There are pleural effusions bilaterally, fairly
small but increased on the right and new on the left.

4.  Splenomegaly.  No focal splenic lesions.

5.  Hepatomegaly with diffuse hepatic steatosis.

6. Edema throughout portions of the abdominal and pelvic walls.
Question a degree of underlying anasarca.

7.  Aortoiliac atherosclerosis.

8.  Small ventral hernia containing only fat.

Aortic Atherosclerosis ([id number].K).
